# Patient Record
Sex: Male | Born: 1965 | Race: Black or African American | Hispanic: No | Marital: Married | Smoking: Never smoker
Health system: Southern US, Community
[De-identification: ages and names within clinical notes are randomized; demographics above are authoritative.]

## PROBLEM LIST (undated history)

## (undated) DIAGNOSIS — T7840XA Allergy, unspecified, initial encounter: Secondary | ICD-10-CM

## (undated) DIAGNOSIS — Z973 Presence of spectacles and contact lenses: Secondary | ICD-10-CM

## (undated) DIAGNOSIS — E785 Hyperlipidemia, unspecified: Secondary | ICD-10-CM

## (undated) HISTORY — PX: NO PAST SURGERIES: SHX2092

## (undated) HISTORY — DX: Hyperlipidemia, unspecified: E78.5

## (undated) HISTORY — DX: Allergy, unspecified, initial encounter: T78.40XA

---

## 2009-02-19 ENCOUNTER — Encounter: Admission: RE | Admit: 2009-02-19 | Discharge: 2009-02-19 | Payer: Self-pay | Admitting: Specialist

## 2011-04-05 ENCOUNTER — Ambulatory Visit (INDEPENDENT_AMBULATORY_CARE_PROVIDER_SITE_OTHER): Payer: Managed Care, Other (non HMO)

## 2011-04-05 DIAGNOSIS — Z13220 Encounter for screening for lipoid disorders: Secondary | ICD-10-CM

## 2011-04-05 DIAGNOSIS — Z125 Encounter for screening for malignant neoplasm of prostate: Secondary | ICD-10-CM

## 2011-04-05 DIAGNOSIS — Z23 Encounter for immunization: Secondary | ICD-10-CM

## 2011-04-05 DIAGNOSIS — Z0000 Encounter for general adult medical examination without abnormal findings: Secondary | ICD-10-CM

## 2012-03-26 ENCOUNTER — Ambulatory Visit (INDEPENDENT_AMBULATORY_CARE_PROVIDER_SITE_OTHER): Payer: Managed Care, Other (non HMO) | Admitting: Physician Assistant

## 2012-03-26 VITALS — BP 124/79 | HR 67 | Temp 97.9°F | Resp 16 | Ht 72.0 in | Wt 207.0 lb

## 2012-03-26 DIAGNOSIS — Z1329 Encounter for screening for other suspected endocrine disorder: Secondary | ICD-10-CM

## 2012-03-26 DIAGNOSIS — Z23 Encounter for immunization: Secondary | ICD-10-CM

## 2012-03-26 MED ORDER — TYPHOID VACCINE PO CPDR: 1.0000 | DELAYED_RELEASE_CAPSULE | ORAL | Status: DC

## 2012-03-26 MED ORDER — DOXYCYCLINE HYCLATE 100 MG PO CAPS: 100.0000 mg | ORAL_CAPSULE | Freq: Every day | ORAL | Status: DC

## 2012-03-26 NOTE — Progress Notes (Signed)
   Patient ID: Adam Cortez MRN: [id number], DOB: 1966-03-18, 46 y.o. Date of Encounter: 03/26/2012, 10:17 AM  Primary Physician: No primary provider on file.  Chief Complaint: Travel medicine  HPI: 46 y.o. year old male with history below presents for travel medicine. Will be traveling to [place] to visit with family on 04/10/12. Will be staying for one or two months, he is not certain which. He has a direct flight from the [place] to [place]. He will be be entering any other countries. He has completed his hepatitis A and hepatitis B vaccines. His last tetanus vaccine was two years ago when he last went to [place]. He also requests a flu vaccine today. He is generally healthy.    No past medical history on file.   Home Meds: Prior to Admission medications   Not on File    Allergies: No Known Allergies  History   Social History  . Marital Status: Married    Spouse Name: N/A    Number of Children: N/A  . Years of Education: N/A   Occupational History  . Not on file.   Social History Main Topics  . Smoking status: Never Smoker   . Smokeless tobacco: Not on file  . Alcohol Use: Not on file  . Drug Use: Not on file  . Sexually Active: Not on file   Other Topics Concern  . Not on file   Social History Narrative  . No narrative on file     Review of Systems: Constitutional: negative for chills, fever, night sweats, weight changes, or fatigue  Cardiovascular: negative for chest pain or palpitations Respiratory: negative for hemoptysis, wheezing, shortness of breath, or cough Abdominal: negative for abdominal pain, nausea, vomiting, diarrhea, or constipation Dermatological: negative for rash Neurologic: negative for headache   Physical Exam: Blood pressure 124/79, pulse 67, temperature 97.9 F (36.6 C), resp. rate 16, height 6' (1.829 m), weight 207 lb (93.895 kg)., Body mass index is 28.07 kg/(m^2). General: Well developed, well nourished, in no acute  distress. Head: Normocephalic, atraumatic, eyes without discharge, sclera non-icteric, nares are without discharge. Bilateral auditory canals clear, TM's are without perforation, pearly grey and translucent with reflective cone of light bilaterally. Oral cavity moist, posterior pharynx without exudate, erythema, peritonsillar abscess, or post nasal drip.  Neck: Supple. No thyromegaly. Full ROM. No lymphadenopathy. Lungs: Clear bilaterally to auscultation without wheezes, rales, or rhonchi. Breathing is unlabored. Heart: RRR with S1 S2. No murmurs, rubs, or gallops appreciated. Msk:  Strength and tone normal for age. Extremities/Skin: Warm and dry. No clubbing or cyanosis. No edema. No rashes or suspicious lesions. Neuro: Alert and oriented X 3. Moves all extremities spontaneously. Gait is normal. CNII-XII grossly in tact. Psych:  Responds to questions appropriately with a normal affect.     ASSESSMENT AND PLAN:  46 y.o. year old male here for travel medicine review. -Traveling to [place] for 1-2 months to visit family -Completed hepatitis A vaccine series -Completed hepatitis B vaccines series -Last tetanus vaccine 2 years ago -Menactra given in office today -Influenza vaccine given in office today -Vivotif 1 po qod for 4 doses, complete series greater than one week prior to exposure #4 no RF -Doxycycline 100 mg 1 po daily, start series two days before trip, continue for 4 weeks after trip #103 no RF -Declines nausea PPX   Signed, Eula Listen, PA-C 03/26/2012 10:17 AM

## 2012-03-31 ENCOUNTER — Other Ambulatory Visit: Payer: Self-pay | Admitting: Physician Assistant

## 2013-04-05 ENCOUNTER — Ambulatory Visit (INDEPENDENT_AMBULATORY_CARE_PROVIDER_SITE_OTHER): Payer: Managed Care, Other (non HMO) | Admitting: Family Medicine

## 2013-04-05 VITALS — BP 138/78 | HR 55 | Temp 97.7°F | Resp 16 | Ht 73.0 in | Wt 203.0 lb

## 2013-04-05 DIAGNOSIS — D179 Benign lipomatous neoplasm, unspecified: Secondary | ICD-10-CM

## 2013-04-05 DIAGNOSIS — Z0000 Encounter for general adult medical examination without abnormal findings: Secondary | ICD-10-CM

## 2013-04-05 LAB — TSH: TSH: 1.245 u[IU]/mL (ref 0.350–4.500)

## 2013-04-05 LAB — CBC WITH DIFFERENTIAL/PLATELET
Eosinophils Absolute: 0.1 10*3/uL (ref 0.0–0.7)
Eosinophils Relative: 1 % (ref 0–5)
MCH: 29.6 pg (ref 26.0–34.0)
MCV: 83.5 fL (ref 78.0–100.0)
Neutro Abs: 1.8 10*3/uL (ref 1.7–7.7)
Platelets: 182 10*3/uL (ref 150–400)
WBC: 4.4 10*3/uL (ref 4.0–10.5)

## 2013-04-05 LAB — COMPREHENSIVE METABOLIC PANEL
AST: 19 U/L (ref 0–37)
Albumin: 4.2 g/dL (ref 3.5–5.2)
Alkaline Phosphatase: 39 U/L (ref 39–117)
BUN: 13 mg/dL (ref 6–23)
Glucose, Bld: 102 mg/dL — ABNORMAL HIGH (ref 70–99)
Total Protein: 7.4 g/dL (ref 6.0–8.3)

## 2013-04-05 LAB — LIPID PANEL
HDL: 45 mg/dL (ref >39)
Triglycerides: 77 mg/dL (ref <150)
VLDL: 15 mg/dL (ref 0–40)

## 2013-04-05 LAB — HIV ANTIBODY (ROUTINE TESTING W REFLEX): HIV: NONREACTIVE

## 2013-04-05 LAB — RPR

## 2013-04-05 NOTE — Patient Instructions (Signed)

## 2013-04-05 NOTE — Progress Notes (Addendum)
Subjective:    Patient ID: Adam Cortez, male    DOB: 23-Mar-1966, 47 y.o.   MRN: [id number] This chart was scribed for Sherren Mocha, MD by Valera Castle, ED Scribe. This patient was seen in room 1 and the patient's care was started at 8:05 AM.  Chief Complaint  Patient presents with  . Annual Exam    HPI Adam Cortez is a 47 y.o. male who presents to the [facility] requesting an annual exam. Pt states he is from [place].  He reports a painful lump to the back right side of his shoulder, onset years ago. He states he has not tried anything for it. He reports he has mentioned the lump when seen here before, but they couldn't do anything for him. He denies having seen a surgeon yet for the lump. He reports the pain is usually at its worst when he sleeps. He reports he sleeps on both his sides and his back. He states he sleeps with 2 pillows.   He reports he has not eaten yet today. He denies any urinary symptoms, and any other associated symptoms. He denies h/o smoking. He denies any family medical history. He reports being employed. He denies family h/o prostate problems. He reports wanting his flu vaccination today.   PCP - HOPPER,DAVID, MD  There are no active problems to display for this patient.  No past medical history on file. No past surgical history on file. No Known Allergies Prior to Admission medications   Not on File  No family history on file.  Pt marked off form that he had sex with men, but pt may have been struggling with language barrier. Told us he did not understand many of the questions of the form.  History   Social History  . Marital Status: Married    Spouse Name: N/A    Number of Children: N/A  . Years of Education: N/A   Occupational History  . Not on file.   Social History Main Topics  . Smoking status: Never Smoker   . Smokeless tobacco: Not on file  . Alcohol Use: Not on file  . Drug Use: Not on file  . Sexual Activity: Not on file   Other Topics  Concern  . Not on file   Social History Narrative  . No narrative on file     Review of Systems  Genitourinary: Negative.  Negative for dysuria, urgency, frequency, flank pain, penile swelling, scrotal swelling, difficulty urinating, penile pain and testicular pain.  Musculoskeletal: Negative for arthralgias, back pain, myalgias and neck pain.  Skin:       Positive for lump over back of right shoulder.   All other systems reviewed and are negative.      BP 138/78  Pulse 55  Temp(Src) 97.7 F (36.5 C)  Resp 16  Ht 6\' 1"  (1.854 m)  Wt 203 lb (92.08 kg)  BMI 26.79 kg/m2  SpO2 100% Objective:   Physical Exam  Nursing note and vitals reviewed. Constitutional: He is oriented to person, place, and time. He appears well-developed and well-nourished. No distress.  HENT:  Head: Normocephalic and atraumatic.  Right Ear: Tympanic membrane, external ear and ear canal normal.  Left Ear: Tympanic membrane, external ear and ear canal normal.  Nose: Nose normal.  Mouth/Throat: Uvula is midline, oropharynx is clear and moist and mucous membranes are normal. No oropharyngeal exudate, posterior oropharyngeal edema or posterior oropharyngeal erythema.  Eyes: Conjunctivae and EOM are normal.  Neck: Trachea  normal. Neck supple. No rigidity. No tracheal deviation, no edema, no erythema and normal range of motion present. No mass and no thyromegaly present.  Cardiovascular: Normal rate, regular rhythm and normal heart sounds.  Exam reveals no gallop and no friction rub.   No murmur heard. Pulmonary/Chest: Effort normal and breath sounds normal. No respiratory distress. He has no wheezes. He has no rales.  Abdominal: Soft. He exhibits no distension. There is no tenderness. There is no rebound and no guarding.  Musculoskeletal: Normal range of motion. He exhibits no tenderness.  Neurological: He is alert and oriented to person, place, and time. He has normal reflexes. He displays normal reflexes.    Reflex Scores:      Bicep reflexes are 2+ on the right side and 2+ on the left side.      Brachioradialis reflexes are 2+ on the right side and 2+ on the left side.      Patellar reflexes are 2+ on the right side and 2+ on the left side. Skin: Skin is warm and dry.  11 cm round, soft, non mobile, subdermal mass, sub defined approximately 3 cm in height over right upper thoracic between spine and scapula.  Psychiatric: He has a normal mood and affect. His behavior is normal.       Assessment & Plan:  Routine general medical examination at a health care facility - Plan: GC/Chlamydia Probe Amp, Lipid panel, Comprehensive metabolic panel, CBC with Differential, HIV antibody, Hepatitis C antibody, RPR, TSH  Lipoma - Plan: Ambulatory referral to General Surgery  No orders of the defined types were placed in this encounter.   -Completed hepatitis A vaccine series  -Completed hepatitis B vaccines series  -Last tetanus vaccine 2011  -Menactra given in office 03/26/2012 -Oral typhoid vaccine 03/2012 - Flu vaccine today  I personally performed the services described in this documentation, which was scribed in my presence. The recorded information has been reviewed and considered, and addended by me as needed.  Norberto Sorenson, MD MPH

## 2013-04-06 ENCOUNTER — Encounter: Payer: Self-pay | Admitting: Family Medicine

## 2013-04-06 LAB — GC/CHLAMYDIA PROBE AMP: GC Probe RNA: NEGATIVE

## 2013-04-13 ENCOUNTER — Ambulatory Visit (INDEPENDENT_AMBULATORY_CARE_PROVIDER_SITE_OTHER): Payer: Managed Care, Other (non HMO) | Admitting: Surgery

## 2013-05-09 ENCOUNTER — Encounter (INDEPENDENT_AMBULATORY_CARE_PROVIDER_SITE_OTHER): Payer: Self-pay | Admitting: Surgery

## 2013-05-09 ENCOUNTER — Ambulatory Visit (INDEPENDENT_AMBULATORY_CARE_PROVIDER_SITE_OTHER): Payer: Managed Care, Other (non HMO) | Admitting: Surgery

## 2013-05-09 ENCOUNTER — Encounter (INDEPENDENT_AMBULATORY_CARE_PROVIDER_SITE_OTHER): Payer: Self-pay

## 2013-05-09 VITALS — BP 126/82 | HR 64 | Temp 97.3°F | Resp 14 | Ht 75.0 in | Wt 204.8 lb

## 2013-05-09 DIAGNOSIS — D1779 Benign lipomatous neoplasm of other sites: Secondary | ICD-10-CM

## 2013-05-09 DIAGNOSIS — D171 Benign lipomatous neoplasm of skin and subcutaneous tissue of trunk: Secondary | ICD-10-CM

## 2013-05-09 NOTE — Progress Notes (Signed)
Patient ID: Adam Cortez, male   DOB: 1965/08/22, 48 y.o.   MRN: [id number]  Chief Complaint  Patient presents with  . New Evaluation    eval lump on backside of rt shoulder    HPI Adam Cortez is a 48 y.o. male.  Referred by Dr. Delman Cheadle for evaluation of mass on back HPI This is a healthy 48 year old male who presents with several years of an enlarging mass on the right side of his back. This has become quite large and uncomfortable. The patient is fairly active. This mass does not interfere with his activity. He presents now to discuss excision. He denies any episodes of infection in this area.   History reviewed. No pertinent past medical history.  History reviewed. No pertinent past surgical history.  History reviewed. No pertinent family history.  Social History History  Substance Use Topics  . Smoking status: Never Smoker   . Smokeless tobacco: Never Used  . Alcohol Use: No    No Known Allergies  No current outpatient prescriptions on file.   No current facility-administered medications for this visit.    Review of Systems Review of Systems  Constitutional: Negative for fever, chills and unexpected weight change.  HENT: Negative for congestion, hearing loss, sore throat, trouble swallowing and voice change.   Eyes: Negative for visual disturbance.  Respiratory: Negative for cough and wheezing.   Cardiovascular: Negative for chest pain, palpitations and leg swelling.  Gastrointestinal: Negative for nausea, vomiting, abdominal pain, diarrhea, constipation, blood in stool, abdominal distention, anal bleeding and rectal pain.  Genitourinary: Negative for hematuria and difficulty urinating.  Musculoskeletal: Negative for arthralgias.  Skin: Negative for rash and wound.  Neurological: Negative for seizures, syncope, weakness and headaches.  Hematological: Negative for adenopathy. Does not bruise/bleed easily.  Psychiatric/Behavioral: Negative for confusion.    Blood  pressure 126/82, pulse 64, temperature 97.3 F (36.3 C), temperature source Temporal, resp. rate 14, height 6\' 3"  (1.905 m), weight 204 lb 12.8 oz (92.897 kg).  Physical Exam Physical Exam WDWN in NAD HEENT:  EOMI, sclera anicteric Neck:  No masses, no thyromegaly Lungs:  CTA bilaterally; normal respiratory effort CV:  Regular rate and rhythm; no murmurs Abd:  +bowel sounds, soft, non-tender, no masses Ext:  Well-perfused; no edema Skin:  Warm, dry; back - medial to right scapula - 11 cm protruding subcutaneous mass; well-demarcated; non-tender  Data Reviewed none  Assessment    Subcutaneous lipoma - back 11 cm     Plan    Excision of subcutaneous lipoma - back 11 cm.  The surgical procedure has been discussed with the patient.  Potential risks, benefits, alternative treatments, and expected outcomes have been explained.  All of the patient's questions at this time have been answered.  The likelihood of reaching the patient's treatment goal is good.  The patient understand the proposed surgical procedure and wishes to proceed.         Adam Capili K. 05/09/2013, 10:22 AM

## 2013-05-30 ENCOUNTER — Encounter (HOSPITAL_BASED_OUTPATIENT_CLINIC_OR_DEPARTMENT_OTHER): Payer: Self-pay | Admitting: *Deleted

## 2013-05-30 NOTE — Progress Notes (Signed)
No labs needed

## 2013-06-03 ENCOUNTER — Ambulatory Visit (HOSPITAL_BASED_OUTPATIENT_CLINIC_OR_DEPARTMENT_OTHER)
Admission: RE | Admit: 2013-06-03 | Discharge: 2013-06-03 | Disposition: A | Discharge status: Home / Self Care | Payer: Managed Care, Other (non HMO) | Source: Ambulatory Visit | Attending: Surgery | Admitting: Surgery

## 2013-06-03 ENCOUNTER — Encounter (HOSPITAL_BASED_OUTPATIENT_CLINIC_OR_DEPARTMENT_OTHER): Payer: Self-pay | Admitting: Certified Registered"

## 2013-06-03 ENCOUNTER — Encounter (HOSPITAL_BASED_OUTPATIENT_CLINIC_OR_DEPARTMENT_OTHER)
Admission: RE | Disposition: A | Discharge status: Home / Self Care | Payer: Self-pay | Source: Ambulatory Visit | Attending: Surgery

## 2013-06-03 ENCOUNTER — Ambulatory Visit (HOSPITAL_BASED_OUTPATIENT_CLINIC_OR_DEPARTMENT_OTHER): Payer: Managed Care, Other (non HMO) | Admitting: Certified Registered"

## 2013-06-03 ENCOUNTER — Encounter (HOSPITAL_BASED_OUTPATIENT_CLINIC_OR_DEPARTMENT_OTHER): Payer: Managed Care, Other (non HMO) | Admitting: Certified Registered"

## 2013-06-03 DIAGNOSIS — D1739 Benign lipomatous neoplasm of skin and subcutaneous tissue of other sites: Secondary | ICD-10-CM

## 2013-06-03 DIAGNOSIS — D171 Benign lipomatous neoplasm of skin and subcutaneous tissue of trunk: Secondary | ICD-10-CM

## 2013-06-03 DIAGNOSIS — D1779 Benign lipomatous neoplasm of other sites: Secondary | ICD-10-CM | POA: Insufficient documentation

## 2013-06-03 HISTORY — PX: LIPOMA EXCISION: SHX5283

## 2013-06-03 HISTORY — DX: Presence of spectacles and contact lenses: Z97.3

## 2013-06-03 LAB — POCT HEMOGLOBIN-HEMACUE: Hemoglobin: 16.1 g/dL (ref 13.0–17.0)

## 2013-06-03 SURGERY — EXCISION LIPOMA
Anesthesia: General | Site: Back | Laterality: Right

## 2013-06-03 MED ORDER — OXYCODONE HCL 5 MG/5ML PO SOLN: 5.0000 mg | Freq: Once | ORAL | Status: DC | PRN

## 2013-06-03 MED ORDER — BUPIVACAINE-EPINEPHRINE PF 0.25-1:200000 % IJ SOLN
INTRAMUSCULAR | Status: AC
  Filled 2013-06-03: qty 30

## 2013-06-03 MED ORDER — CEFAZOLIN SODIUM-DEXTROSE 2-3 GM-% IV SOLR
INTRAVENOUS | Status: AC
  Filled 2013-06-03: qty 50

## 2013-06-03 MED ORDER — DEXAMETHASONE SODIUM PHOSPHATE 4 MG/ML IJ SOLN
INTRAMUSCULAR | Status: DC | PRN
  Administered 2013-06-03: 10 mg via INTRAVENOUS

## 2013-06-03 MED ORDER — PROMETHAZINE HCL 25 MG/ML IJ SOLN: 6.2500 mg | INTRAMUSCULAR | Status: DC | PRN

## 2013-06-03 MED ORDER — HYDROMORPHONE HCL PF 1 MG/ML IJ SOLN: 0.2500 mg | INTRAMUSCULAR | Status: DC | PRN

## 2013-06-03 MED ORDER — FENTANYL CITRATE 0.05 MG/ML IJ SOLN: 50.0000 ug | INTRAMUSCULAR | Status: DC | PRN

## 2013-06-03 MED ORDER — LIDOCAINE HCL 4 % MT SOLN
OROMUCOSAL | Status: DC | PRN
  Administered 2013-06-03: 4 mL via TOPICAL

## 2013-06-03 MED ORDER — HYDROCODONE-ACETAMINOPHEN 5-325 MG PO TABS: 1.0000 | ORAL_TABLET | ORAL | Status: DC | PRN

## 2013-06-03 MED ORDER — OXYCODONE HCL 5 MG PO TABS: 5.0000 mg | ORAL_TABLET | Freq: Once | ORAL | Status: DC | PRN

## 2013-06-03 MED ORDER — PROPOFOL 10 MG/ML IV BOLUS
INTRAVENOUS | Status: DC | PRN
  Administered 2013-06-03: 200 mg via INTRAVENOUS

## 2013-06-03 MED ORDER — ONDANSETRON HCL 4 MG/2ML IJ SOLN
INTRAMUSCULAR | Status: DC | PRN
  Administered 2013-06-03: 4 mg via INTRAVENOUS

## 2013-06-03 MED ORDER — KETOROLAC TROMETHAMINE 30 MG/ML IJ SOLN
INTRAMUSCULAR | Status: DC | PRN
  Administered 2013-06-03: 30 mg via INTRAVENOUS

## 2013-06-03 MED ORDER — CEFAZOLIN SODIUM-DEXTROSE 2-3 GM-% IV SOLR
2.0000 g | INTRAVENOUS | Status: AC
  Administered 2013-06-03: 2 g via INTRAVENOUS

## 2013-06-03 MED ORDER — SUCCINYLCHOLINE CHLORIDE 20 MG/ML IJ SOLN
INTRAMUSCULAR | Status: AC
  Filled 2013-06-03: qty 1

## 2013-06-03 MED ORDER — FENTANYL CITRATE 0.05 MG/ML IJ SOLN
INTRAMUSCULAR | Status: AC
  Filled 2013-06-03: qty 6

## 2013-06-03 MED ORDER — FENTANYL CITRATE 0.05 MG/ML IJ SOLN
INTRAMUSCULAR | Status: DC | PRN
  Administered 2013-06-03: 100 ug via INTRAVENOUS

## 2013-06-03 MED ORDER — CHLORHEXIDINE GLUCONATE 4 % EX LIQD: 1.0000 "application " | Freq: Once | CUTANEOUS | Status: DC

## 2013-06-03 MED ORDER — ONDANSETRON HCL 4 MG/2ML IJ SOLN: 4.0000 mg | INTRAMUSCULAR | Status: DC | PRN

## 2013-06-03 MED ORDER — BUPIVACAINE-EPINEPHRINE 0.25% -1:200000 IJ SOLN
INTRAMUSCULAR | Status: DC | PRN
  Administered 2013-06-03: 20 mL

## 2013-06-03 MED ORDER — MIDAZOLAM HCL 2 MG/2ML IJ SOLN
INTRAMUSCULAR | Status: AC
  Filled 2013-06-03: qty 2

## 2013-06-03 MED ORDER — MORPHINE SULFATE 2 MG/ML IJ SOLN: 2.0000 mg | INTRAMUSCULAR | Status: DC | PRN

## 2013-06-03 MED ORDER — LACTATED RINGERS IV SOLN
INTRAVENOUS | Status: DC
Start: 2013-06-03 — End: 2013-06-03
  Administered 2013-06-03: 12:00:00 via INTRAVENOUS

## 2013-06-03 MED ORDER — MIDAZOLAM HCL 2 MG/2ML IJ SOLN: 1.0000 mg | INTRAMUSCULAR | Status: DC | PRN

## 2013-06-03 MED ORDER — MIDAZOLAM HCL 5 MG/5ML IJ SOLN
INTRAMUSCULAR | Status: DC | PRN
  Administered 2013-06-03: 2 mg via INTRAVENOUS

## 2013-06-03 MED ORDER — SUCCINYLCHOLINE CHLORIDE 20 MG/ML IJ SOLN
INTRAMUSCULAR | Status: DC | PRN
  Administered 2013-06-03: 100 mg via INTRAVENOUS

## 2013-06-03 SURGICAL SUPPLY — 45 items
BENZOIN TINCTURE PRP APPL 2/3 (GAUZE/BANDAGES/DRESSINGS) ×3 IMPLANT
BLADE SURG 15 STRL LF DISP TIS (BLADE) ×1 IMPLANT
BLADE SURG 15 STRL SS (BLADE) ×2
BLADE SURG ROTATE 9660 (MISCELLANEOUS) IMPLANT
CANISTER SUCT 1200ML W/VALVE (MISCELLANEOUS) IMPLANT
CHLORAPREP W/TINT 26ML (MISCELLANEOUS) ×3 IMPLANT
CLOSURE WOUND 1/2 X4 (GAUZE/BANDAGES/DRESSINGS) ×1
COVER MAYO STAND STRL (DRAPES) ×3 IMPLANT
COVER TABLE BACK 60X90 (DRAPES) ×3 IMPLANT
DECANTER SPIKE VIAL GLASS SM (MISCELLANEOUS) IMPLANT
DRAIN JP 10F RND SILICONE (MISCELLANEOUS) ×3 IMPLANT
DRAPE PED LAPAROTOMY (DRAPES) ×3 IMPLANT
DRAPE UTILITY XL STRL (DRAPES) ×3 IMPLANT
ELECT COATED BLADE 2.86 ST (ELECTRODE) ×3 IMPLANT
ELECT REM PT RETURN 9FT ADLT (ELECTROSURGICAL) ×3
ELECTRODE REM PT RTRN 9FT ADLT (ELECTROSURGICAL) ×1 IMPLANT
EVACUATOR SILICONE 100CC (DRAIN) ×3 IMPLANT
GLOVE BIO SURGEON STRL SZ7 (GLOVE) ×3 IMPLANT
GLOVE BIOGEL PI IND STRL 7.0 (GLOVE) ×1 IMPLANT
GLOVE BIOGEL PI IND STRL 7.5 (GLOVE) ×1 IMPLANT
GLOVE BIOGEL PI INDICATOR 7.0 (GLOVE) ×2
GLOVE BIOGEL PI INDICATOR 7.5 (GLOVE) ×2
GLOVE ECLIPSE 6.5 STRL STRAW (GLOVE) ×3 IMPLANT
GOWN STRL REUS W/ TWL LRG LVL3 (GOWN DISPOSABLE) ×2 IMPLANT
GOWN STRL REUS W/TWL LRG LVL3 (GOWN DISPOSABLE) ×4
NEEDLE HYPO 25X1 1.5 SAFETY (NEEDLE) ×3 IMPLANT
NS IRRIG 1000ML POUR BTL (IV SOLUTION) ×3 IMPLANT
PACK BASIN DAY SURGERY FS (CUSTOM PROCEDURE TRAY) ×3 IMPLANT
PENCIL BUTTON HOLSTER BLD 10FT (ELECTRODE) ×3 IMPLANT
PIN SAFETY STERILE (MISCELLANEOUS) ×3 IMPLANT
SPONGE GAUZE 4X4 12PLY STER LF (GAUZE/BANDAGES/DRESSINGS) ×3 IMPLANT
STRIP CLOSURE SKIN 1/2X4 (GAUZE/BANDAGES/DRESSINGS) ×2 IMPLANT
SUT ETHILON 2 0 FS 18 (SUTURE) ×3 IMPLANT
SUT MON AB 4-0 PC3 18 (SUTURE) ×3 IMPLANT
SUT PROLENE 6 0 P 1 18 (SUTURE) IMPLANT
SUT SILK 2 0 FS (SUTURE) IMPLANT
SUT VIC AB 3-0 SH 27 (SUTURE)
SUT VIC AB 3-0 SH 27X BRD (SUTURE) IMPLANT
SUT VICRYL 3-0 CR8 SH (SUTURE) ×3 IMPLANT
SYR CONTROL 10ML LL (SYRINGE) ×3 IMPLANT
TOWEL OR 17X24 6PK STRL BLUE (TOWEL DISPOSABLE) ×3 IMPLANT
TOWEL OR NON WOVEN STRL DISP B (DISPOSABLE) ×3 IMPLANT
TUBE CONNECTING 20'X1/4 (TUBING)
TUBE CONNECTING 20X1/4 (TUBING) IMPLANT
YANKAUER SUCT BULB TIP NO VENT (SUCTIONS) IMPLANT

## 2013-06-03 NOTE — Interval H&P Note (Signed)
History and Physical Interval Note:  06/03/2013 11:43 AM  Adam Cortez  has presented today for surgery, with the diagnosis of Subcutaneous lipoma - back - 11 cm  The various methods of treatment have been discussed with the patient and family. After consideration of risks, benefits and other options for treatment, the patient has consented to  Procedure(s): EXCISION LIPOMA BACK (N/A) as a surgical intervention .  The patient's history has been reviewed, patient examined, no change in status, stable for surgery.  I have reviewed the patient's chart and labs.  Questions were answered to the patient's satisfaction.     Tanaiya Kolarik K.

## 2013-06-03 NOTE — Op Note (Signed)
Preop diagnosis: 11 cm subcutaneous lipoma of the back Postop diagnosis: Same Procedure performed: Excision of subcutaneous lipoma of the back Surgeon:Marilyn Nihiser K. Anesthesia: Gen. Indications: This is a 48 year old male in good health who presents with Several years of an enlarging mass in his back. This has become very large and uncomfortable. He presents now for excision.  Description of procedure: The patient brought to the operating room and placed in supine position on the stretcher. After an adequate level of general anesthesia was obtained, the patient was flipped to a prone position on the table. The upper part of his back was prepped with chlor prep and draped in sterile fashion. A timeout was taken to ensure the proper patient and proper procedure. We infiltrated the area around the mass with 0.25% Marcaine with epinephrine. A transverse incision was made and dissection was carried down to the surface of the lipoma. The lipoma had multiple lobules and "fingers" extending into the subcutaneous tissues. We had to bluntly dissect each of these lobules free from the surrounding tissue. The lipoma was adherent to the fascia of the underlying muscle. We excised the lipoma in its entirety. We examined carefully for hemostasis. We infiltrated the surrounding muscle with 0.25% Marcaine with epinephrine. A 10 French round drain was placed in the subcutaneous tissues through a stab incision. The wound was closed with a deep layer of interrupted 3-0 Vicryl suture and a subcuticular layer of 4-0 Monocryl. Steri-Strips were applied. The drain was secured with a nylon suture.  The patient was then extubated and brought to recovery in stable condition. All sponge, initially, and needle counts are correct.  Imogene Burn. Georgette Dover, MD, [facility] Surgery  General/ Trauma Surgery  06/03/2013 1:49 PM

## 2013-06-03 NOTE — Anesthesia Preprocedure Evaluation (Addendum)
Anesthesia Evaluation  Patient identified by MRN, date of birth, ID band Patient awake    Reviewed: Allergy & Precautions, H&P , NPO status , Patient's Chart, lab work & pertinent test results  Airway Mallampati: I TM Distance: >3 FB     Dental  (+) Dental Advidsory Given and Teeth Intact   Pulmonary neg pulmonary ROS,  breath sounds clear to auscultation        Cardiovascular negative cardio ROS  Rhythm:regular Rate:Normal     Neuro/Psych negative neurological ROS  negative psych ROS   GI/Hepatic negative GI ROS, Neg liver ROS,   Endo/Other  negative endocrine ROS  Renal/GU negative Renal ROS     Musculoskeletal   Abdominal   Peds  Hematology negative hematology ROS (+)   Anesthesia Other Findings   Reproductive/Obstetrics negative OB ROS                           Anesthesia Physical Anesthesia Plan  ASA: I  Anesthesia Plan: General ETT   Post-op Pain Management:    Induction:   Airway Management Planned:   Additional Equipment:   Intra-op Plan:   Post-operative Plan:   Informed Consent: I have reviewed the patients History and Physical, chart, labs and discussed the procedure including the risks, benefits and alternatives for the proposed anesthesia with the patient or authorized representative who has indicated his/her understanding and acceptance.   Dental Advisory Given  Plan Discussed with: Anesthesiologist, CRNA and Surgeon  Anesthesia Plan Comments:         Anesthesia Quick Evaluation

## 2013-06-03 NOTE — H&P (View-Only) (Signed)
Patient ID: Adam Cortez, male   DOB: 09/10/1965, 47 y.o.   MRN: [id number]  Chief Complaint  Patient presents with  . New Evaluation    eval lump on backside of rt shoulder    HPI Adam Cortez is a 47 y.o. male.  Referred by Dr. Eva Shaw for evaluation of mass on back HPI This is a healthy 47-year-old male who presents with several years of an enlarging mass on the right side of his back. This has become quite large and uncomfortable. The patient is fairly active. This mass does not interfere with his activity. He presents now to discuss excision. He denies any episodes of infection in this area.   History reviewed. No pertinent past medical history.  History reviewed. No pertinent past surgical history.  History reviewed. No pertinent family history.  Social History History  Substance Use Topics  . Smoking status: Never Smoker   . Smokeless tobacco: Never Used  . Alcohol Use: No    No Known Allergies  No current outpatient prescriptions on file.   No current facility-administered medications for this visit.    Review of Systems Review of Systems  Constitutional: Negative for fever, chills and unexpected weight change.  HENT: Negative for congestion, hearing loss, sore throat, trouble swallowing and voice change.   Eyes: Negative for visual disturbance.  Respiratory: Negative for cough and wheezing.   Cardiovascular: Negative for chest pain, palpitations and leg swelling.  Gastrointestinal: Negative for nausea, vomiting, abdominal pain, diarrhea, constipation, blood in stool, abdominal distention, anal bleeding and rectal pain.  Genitourinary: Negative for hematuria and difficulty urinating.  Musculoskeletal: Negative for arthralgias.  Skin: Negative for rash and wound.  Neurological: Negative for seizures, syncope, weakness and headaches.  Hematological: Negative for adenopathy. Does not bruise/bleed easily.  Psychiatric/Behavioral: Negative for confusion.    Blood  pressure 126/82, pulse 64, temperature 97.3 F (36.3 C), temperature source Temporal, resp. rate 14, height 6' 3" (1.905 m), weight 204 lb 12.8 oz (92.897 kg).  Physical Exam Physical Exam WDWN in NAD HEENT:  EOMI, sclera anicteric Neck:  No masses, no thyromegaly Lungs:  CTA bilaterally; normal respiratory effort CV:  Regular rate and rhythm; no murmurs Abd:  +bowel sounds, soft, non-tender, no masses Ext:  Well-perfused; no edema Skin:  Warm, dry; back - medial to right scapula - 11 cm protruding subcutaneous mass; well-demarcated; non-tender  Data Reviewed none  Assessment    Subcutaneous lipoma - back 11 cm     Plan    Excision of subcutaneous lipoma - back 11 cm.  The surgical procedure has been discussed with the patient.  Potential risks, benefits, alternative treatments, and expected outcomes have been explained.  All of the patient's questions at this time have been answered.  The likelihood of reaching the patient's treatment goal is good.  The patient understand the proposed surgical procedure and wishes to proceed.         [name] 05/09/2013, 10:22 AM    

## 2013-06-03 NOTE — Discharge Instructions (Signed)
Empty the drain and record the amount of output daily. Keep the wound dry until the drain is removed next Tuesday. He may apply an ice pack to the area to help with pain. He may use the pain medicine as needed for pain. Tried to avoid constipation by using a stool softener. My office will contact you for the appointment time next Tuesday.     Post Anesthesia Home Care Instructions  Activity: Get plenty of rest for the remainder of the day. A responsible adult should stay with you for 24 hours following the procedure.  For the next 24 hours, DO NOT: -Drive a car -Paediatric nurse -Drink alcoholic beverages -Take any medication unless instructed by your physician -Make any legal decisions or sign important papers.  Meals: Start with liquid foods such as gelatin or soup. Progress to regular foods as tolerated. Avoid greasy, spicy, heavy foods. If nausea and/or vomiting occur, drink only clear liquids until the nausea and/or vomiting subsides. Call your physician if vomiting continues.  Special Instructions/Symptoms: Your throat may feel dry or sore from the anesthesia or the breathing tube placed in your throat during surgery. If this causes discomfort, gargle with warm salt water. The discomfort should disappear within 24 hours.

## 2013-06-03 NOTE — Anesthesia Postprocedure Evaluation (Signed)
Anesthesia Post Note  Patient: Adam Cortez  Procedure(s) Performed: Procedure(s) (LRB): EXCISION LIPOMA BACK (Right)  Anesthesia type: general  Patient location: PACU  Post pain: Pain level controlled  Post assessment: Patient's Cardiovascular Status Stable  Last Vitals:  Filed Vitals:   06/03/13 1430  BP: 135/94  Pulse: 58  Temp:   Resp: 14    Post vital signs: Reviewed and stable  Level of consciousness: sedated  Complications: No apparent anesthesia complications

## 2013-06-03 NOTE — Anesthesia Procedure Notes (Signed)
Procedure Name: Intubation Date/Time: 06/03/2013 12:45 PM Performed by: Ayat Drenning Pre-anesthesia Checklist: Patient identified, Emergency Drugs available, Suction available and Patient being monitored Patient Re-evaluated:Patient Re-evaluated prior to inductionOxygen Delivery Method: Circle System Utilized Preoxygenation: Pre-oxygenation with 100% oxygen Intubation Type: IV induction Ventilation: Mask ventilation without difficulty Laryngoscope Size: Mac and 3 Grade View: Grade I Tube type: Oral Tube size: 7.0 mm Number of attempts: 1 Airway Equipment and Method: stylet and oral airway Placement Confirmation: ETT inserted through vocal cords under direct vision,  positive ETCO2 and breath sounds checked- equal and bilateral Secured at: 22 cm Tube secured with: Tape Dental Injury: Teeth and Oropharynx as per pre-operative assessment

## 2013-06-03 NOTE — Transfer of Care (Signed)
Immediate Anesthesia Transfer of Care Note  Patient: Adam Cortez  Procedure(s) Performed: Procedure(s): EXCISION LIPOMA BACK (Right)  Patient Location: PACU  Anesthesia Type:General  Level of Consciousness: awake and sedated  Airway & Oxygen Therapy: Patient Spontanous Breathing and Patient connected to face mask oxygen  Post-op Assessment: Report given to PACU RN and Post -op Vital signs reviewed and stable  Post vital signs: Reviewed and stable  Complications: No apparent anesthesia complications

## 2013-06-06 ENCOUNTER — Encounter (HOSPITAL_BASED_OUTPATIENT_CLINIC_OR_DEPARTMENT_OTHER): Payer: Self-pay | Admitting: Surgery

## 2013-06-07 ENCOUNTER — Encounter (INDEPENDENT_AMBULATORY_CARE_PROVIDER_SITE_OTHER): Payer: Self-pay | Admitting: Surgery

## 2013-06-07 ENCOUNTER — Ambulatory Visit (INDEPENDENT_AMBULATORY_CARE_PROVIDER_SITE_OTHER): Payer: Managed Care, Other (non HMO) | Admitting: Surgery

## 2013-06-07 DIAGNOSIS — D1779 Benign lipomatous neoplasm of other sites: Secondary | ICD-10-CM

## 2013-06-07 DIAGNOSIS — D171 Benign lipomatous neoplasm of skin and subcutaneous tissue of trunk: Secondary | ICD-10-CM

## 2013-06-07 NOTE — Progress Notes (Signed)
Status post excision of a large lipoma from the back on 06/03/13. The patient has a drain in place. There is minimal output. The incision is healing well no sign of infection. The drain is removed. The patient may followup as needed. He may resume full activity.  Imogene Burn. Georgette Dover, MD, [facility] Surgery  General/ Trauma Surgery  06/07/2013 4:21 PM

## 2013-06-15 ENCOUNTER — Encounter (INDEPENDENT_AMBULATORY_CARE_PROVIDER_SITE_OTHER): Payer: Managed Care, Other (non HMO) | Admitting: Surgery

## 2014-02-22 ENCOUNTER — Ambulatory Visit (INDEPENDENT_AMBULATORY_CARE_PROVIDER_SITE_OTHER): Payer: Managed Care, Other (non HMO) | Admitting: Physician Assistant

## 2014-02-22 VITALS — BP 150/90 | HR 66 | Temp 98.2°F | Resp 16 | Ht 72.0 in | Wt 200.0 lb

## 2014-02-22 DIAGNOSIS — Z23 Encounter for immunization: Secondary | ICD-10-CM

## 2014-02-22 DIAGNOSIS — Z7189 Other specified counseling: Secondary | ICD-10-CM

## 2014-02-22 DIAGNOSIS — Z7184 Encounter for health counseling related to travel: Secondary | ICD-10-CM

## 2014-02-22 MED ORDER — DOXYCYCLINE HYCLATE 100 MG PO CAPS: ORAL_CAPSULE | ORAL | Status: DC

## 2014-02-22 NOTE — Progress Notes (Signed)
I have discussed this case with Ms. Bush, PA-C and agree.  

## 2014-02-22 NOTE — Patient Instructions (Signed)
Try to find an up to date vaccine record If no yellow fever vaccine in past year, go to health dept. For booster Take doxycycline starting 2 days before travel, daily while in [place] and for 28 days after you return. Have a great trip!

## 2014-02-22 NOTE — Progress Notes (Signed)
Subjective:    Patient ID: Adam Cortez, male    DOB: 10/04/1965, 48 y.o.   MRN: [id number] Patient Active Problem List   Diagnosis Date Noted  . Lipoma of back - 11 cm 05/09/2013   Prior to Admission medications   Medication Sig Start Date End Date Taking? Authorizing Provider  doxycycline (VIBRAMYCIN) 100 MG capsule Take 1 pill daily starting 2 days before travel, daily while traveling, and daily for 28 days after you return 02/22/14   Ezekiel Slocumb, PA-C   HPI  This is a 48 year old male with no significant PMH here for travel vaccines. He is traveling to [place] on the 25th of November. He will be gone for 2 months. The last time he traveled to [place] was 03/2012. He does not have an immunization record with him. Last tetanus was in 2011. Last typhoid vaccine in 2013. He has finished his Hepatitis A and B vaccines. He is not sure, but thinks he got yellow fever vaccine in the past 10 years.  His BP is a little elevated today. He reports when he has taken it at drug stores, it is lower. He does not have a BP monitor at home.    Review of Systems  Constitutional: Negative for fever and chills.  HENT: Negative.   Eyes: Negative.   Respiratory: Negative for cough and shortness of breath.   Cardiovascular: Negative for chest pain.  Gastrointestinal: Negative.   Skin: Negative.   Allergic/Immunologic: Positive for environmental allergies.       Objective:   Physical Exam  Constitutional: He is oriented to person, place, and time. He appears well-developed and well-nourished. No distress.  HENT:  Head: Normocephalic and atraumatic.  Right Ear: Hearing, tympanic membrane, external ear and ear canal normal.  Left Ear: Hearing, tympanic membrane, external ear and ear canal normal.  Nose: Nose normal. No mucosal edema.  Mouth/Throat: Uvula is midline, oropharynx is clear and moist and mucous membranes are normal. No oropharyngeal exudate or posterior oropharyngeal edema.  Eyes:  Conjunctivae and lids are normal. Right eye exhibits no discharge. Left eye exhibits no discharge. No scleral icterus.  Cardiovascular: Normal rate, regular rhythm and normal heart sounds.   No murmur heard. Pulmonary/Chest: Effort normal and breath sounds normal. No respiratory distress. He has no wheezes. He has no rhonchi. He has no rales.  Abdominal: Soft. There is no tenderness.  Musculoskeletal: Normal range of motion.  Lymphadenopathy:    He has no cervical adenopathy.  Neurological: He is alert and oriented to person, place, and time.  Skin: Skin is warm, dry and intact. No lesion and no rash noted.  Psychiatric: He has a normal mood and affect. His speech is normal and behavior is normal. Thought content normal.      Assessment & Plan:  1. Travel advice encounter  He is up to date on all vaccines needed for his trip to [place]. He thinks he got yellow fever in the past year - he will check at home to make sure and will go to the health dept to get a booster if none within the past 10 years. He will take doxycycline for malaria prophylaxis.  - doxycycline (VIBRAMYCIN) 100 MG capsule; Take 1 pill daily starting 2 days before travel, daily while traveling, and daily for 28 days after you return  Dispense: 90 capsule; Refill: 0  2. Need for influenza vaccination - Flu Vaccine QUAD 36+ mos IM   Benjaman Pott. Drenda Freeze, [facility]  02/22/2014

## 2014-07-21 ENCOUNTER — Encounter (HOSPITAL_COMMUNITY): Payer: Self-pay | Admitting: Emergency Medicine

## 2014-07-21 ENCOUNTER — Emergency Department (HOSPITAL_COMMUNITY): Payer: Managed Care, Other (non HMO)

## 2014-07-21 ENCOUNTER — Emergency Department (HOSPITAL_COMMUNITY)
Admission: EM | Admit: 2014-07-21 | Discharge: 2014-07-21 | Disposition: A | Discharge status: Home / Self Care | Payer: Managed Care, Other (non HMO) | Attending: Emergency Medicine | Admitting: Emergency Medicine

## 2014-07-21 DIAGNOSIS — Z79899 Other long term (current) drug therapy: Secondary | ICD-10-CM | POA: Insufficient documentation

## 2014-07-21 DIAGNOSIS — R079 Chest pain, unspecified: Secondary | ICD-10-CM | POA: Diagnosis present

## 2014-07-21 LAB — TROPONIN I: Troponin I: <0.03 ng/mL (ref <0.031)

## 2014-07-21 LAB — BRAIN NATRIURETIC PEPTIDE: B NATRIURETIC PEPTIDE 5: 15 pg/mL (ref 0.0–100.0)

## 2014-07-21 LAB — CBC
HEMATOCRIT: 41.2 % (ref 39.0–52.0)
HEMOGLOBIN: 14.1 g/dL (ref 13.0–17.0)
MCH: 29.7 pg (ref 26.0–34.0)
MCHC: 34.2 g/dL (ref 30.0–36.0)
MCV: 86.9 fL (ref 78.0–100.0)
Platelets: 152 10*3/uL (ref 150–400)
RBC: 4.74 MIL/uL (ref 4.22–5.81)
RDW: 12.4 % (ref 11.5–15.5)
WBC: 10.3 10*3/uL (ref 4.0–10.5)

## 2014-07-21 LAB — BASIC METABOLIC PANEL
Anion gap: 11 (ref 5–15)
BUN: 16 mg/dL (ref 6–23)
CO2: 23 mmol/L (ref 19–32)
Calcium: 9.2 mg/dL (ref 8.4–10.5)
Chloride: 100 mmol/L (ref 96–112)
Creatinine, Ser: 1.51 mg/dL — ABNORMAL HIGH (ref 0.50–1.35)
GFR calc Af Amer: 62 mL/min — ABNORMAL LOW (ref >90)
GFR calc non Af Amer: 54 mL/min — ABNORMAL LOW (ref >90)
GLUCOSE: 124 mg/dL — AB (ref 70–99)
POTASSIUM: 3.6 mmol/L (ref 3.5–5.1)
SODIUM: 134 mmol/L — AB (ref 135–145)

## 2014-07-21 LAB — D-DIMER, QUANTITATIVE (NOT AT ARMC): D DIMER QUANT: 0.33 ug{FEU}/mL (ref 0.00–0.48)

## 2014-07-21 LAB — I-STAT TROPONIN, ED: Troponin i, poc: 0 ng/mL (ref 0.00–0.08)

## 2014-07-21 MED ORDER — SODIUM CHLORIDE 0.9 % IV BOLUS (SEPSIS)
1000.0000 mL | Freq: Once | INTRAVENOUS | Status: AC
  Administered 2014-07-21: 1000 mL via INTRAVENOUS

## 2014-07-21 NOTE — Discharge Instructions (Signed)

## 2014-07-21 NOTE — ED Notes (Signed)
RN will draw Troponin from IV line

## 2014-07-21 NOTE — ED Provider Notes (Signed)
CSN: [id number]     Arrival date & time 07/21/14  1559 History   First MD Initiated Contact with Patient 07/21/14 1746     Chief Complaint  Patient presents with  . Chest Pain  . Numbness     (Consider location/radiation/quality/duration/timing/severity/associated sxs/prior Treatment) Patient is a 49 y.o. male presenting with chest pain. The history is provided by the patient. No language interpreter was used.  Chest Pain Pain location:  L chest and epigastric Pain quality: aching   Pain radiates to:  L arm and R arm Pain radiates to the back: no   Pain severity:  Moderate Onset quality:  Gradual Timing:  Intermittent Progression:  Resolved Chronicity:  New Relieved by:  Nothing Worsened by:  Nothing tried Ineffective treatments:  None tried Associated symptoms: no abdominal pain and no shortness of breath   Risk factors: no high cholesterol, no hypertension and no smoking   Pt reports he has had chest pain on and off for several days.  Pt reports some relief with tums other days.  Pt reports today at work he had pain and had numbness and tingling in his arms and legs. Pt traveled 2 months ago. No recent travel.  Pt denies any injury  Past Medical History  Diagnosis Date  . Wears glasses    Past Surgical History  Procedure Laterality Date  . No past surgeries    . Lipoma excision Right 06/03/2013    Procedure: EXCISION LIPOMA BACK;  Surgeon: Imogene Burn. Georgette Dover, MD;  Location: [place];  Service: General;  Laterality: Right;   History reviewed. No pertinent family history. History  Substance Use Topics  . Smoking status: Never Smoker   . Smokeless tobacco: Never Used  . Alcohol Use: No    Review of Systems  Respiratory: Negative for shortness of breath.   Cardiovascular: Positive for chest pain.  Gastrointestinal: Negative for abdominal pain.  All other systems reviewed and are negative.     Allergies  Review of patient's allergies indicates no  known allergies.  Home Medications   Prior to Admission medications   Medication Sig Start Date End Date Taking? Authorizing Provider  acetaminophen (TYLENOL) 500 MG tablet Take 1,000 mg by mouth once.   Yes Historical Provider, MD  B Complex Vitamins (VITAMIN B-COMPLEX PO) Take 1 tablet by mouth daily.   Yes Historical Provider, MD  cetirizine (ZYRTEC) 10 MG tablet Take 10 mg by mouth daily.   Yes Historical Provider, MD  Multiple Vitamin (MULTIVITAMIN WITH MINERALS) TABS tablet Take 1 tablet by mouth daily.   Yes Historical Provider, MD  VITAMIN E PO Take 1 capsule by mouth daily.   Yes Historical Provider, MD  VITAMIN E PO Take 1 capsule by mouth daily.   Yes Historical Provider, MD  doxycycline (VIBRAMYCIN) 100 MG capsule Take 1 pill daily starting 2 days before travel, daily while traveling, and daily for 28 days after you return Patient not taking: Reported on 07/21/2014 02/22/14   Bennett Scrape V, PA-C   BP 131/73 mmHg  Pulse 106  Temp(Src) 98.5 F (36.9 C) (Oral)  Resp 16  SpO2 97% Physical Exam  Constitutional: He is oriented to person, place, and time. He appears well-developed and well-nourished.  HENT:  Head: Normocephalic and atraumatic.  Right Ear: External ear normal.  Left Ear: External ear normal.  Nose: Nose normal.  Mouth/Throat: Oropharynx is clear and moist.  Eyes: Conjunctivae and EOM are normal. Pupils are equal, round, and reactive to light.  Neck: Normal range of motion. Neck supple.  Cardiovascular: Normal rate, regular rhythm and normal heart sounds.   Pulmonary/Chest: Effort normal and breath sounds normal.  Abdominal: He exhibits no distension.  Musculoskeletal: Normal range of motion.  Neurological: He is alert and oriented to person, place, and time.  Skin: Skin is warm.  Psychiatric: He has a normal mood and affect.  Nursing note and vitals reviewed.   ED Course  Procedures (including critical care time) Labs Review Labs Reviewed  BASIC  METABOLIC PANEL - Abnormal; Notable for the following:    Sodium 134 (*)    Glucose, Bld 124 (*)    Creatinine, Ser 1.51 (*)    GFR calc non Af Amer 54 (*)    GFR calc Af Amer 62 (*)    All other components within normal limits  CBC  BRAIN NATRIURETIC PEPTIDE  I-STAT TROPOININ, ED    Imaging Review Dg Chest 2 View  07/21/2014   CLINICAL DATA:  49 year old male with mid chest pain and numbness in bilateral arms, with pain radiating into the right side of the jaw. Syncope.  EXAM: CHEST  2 VIEW  COMPARISON:  Chest x-ray 02/19/2009.  FINDINGS: Lung volumes are low. No consolidative airspace disease. No pleural effusions. No pneumothorax. No pulmonary nodule or mass noted. Pulmonary vasculature and the cardiomediastinal silhouette are within normal limits.  IMPRESSION: Low lung volumes without radiographic evidence of acute cardiopulmonary disease.   Electronically Signed   By: Vinnie Langton M.D.   On: 07/21/2014 19:33     EKG Interpretation   Date/Time:  Friday July 21 2014 16:15:42 EDT Ventricular Rate:  109 PR Interval:  150 QRS Duration: 78 QT Interval:  292 QTC Calculation: 393 R Axis:   87 Text Interpretation:  Sinus tachycardia Left atrial enlargement  Nonspecific T abnormalities, inferior leads No old tracing to compare  Confirmed by Debby Freiberg [phone]) on 07/21/2014 7:58:33 PM      Results for orders placed or performed during the hospital encounter of 07/21/14  CBC  Result Value Ref Range   WBC 10.3 4.0 - 10.5 K/uL   RBC 4.74 4.22 - 5.81 MIL/uL   Hemoglobin 14.1 13.0 - 17.0 g/dL   HCT 41.2 39.0 - 52.0 %   MCV 86.9 78.0 - 100.0 fL   MCH 29.7 26.0 - 34.0 pg   MCHC 34.2 30.0 - 36.0 g/dL   RDW 12.4 11.5 - 15.5 %   Platelets 152 150 - 400 K/uL  Basic metabolic panel  Result Value Ref Range   Sodium 134 (L) 135 - 145 mmol/L   Potassium 3.6 3.5 - 5.1 mmol/L   Chloride 100 96 - 112 mmol/L   CO2 23 19 - 32 mmol/L   Glucose, Bld 124 (H) 70 - 99 mg/dL   BUN 16 6 -  23 mg/dL   Creatinine, Ser 1.51 (H) 0.50 - 1.35 mg/dL   Calcium 9.2 8.4 - 10.5 mg/dL   GFR calc non Af Amer 54 (L) >90 mL/min   GFR calc Af Amer 62 (L) >90 mL/min   Anion gap 11 5 - 15  BNP (order ONLY if patient complains of dyspnea/SOB AND you have documented it for THIS visit)  Result Value Ref Range   B Natriuretic Peptide 15.0 0.0 - 100.0 pg/mL  Troponin I  Result Value Ref Range   Troponin I <0.03 <0.031 ng/mL  D-dimer, quantitative  Result Value Ref Range   D-Dimer, Quant 0.33 0.00 - 0.48 ug/mL-FEU  I-stat  troponin, ED (not at [facility])  Result Value Ref Range   Troponin i, poc 0.00 0.00 - 0.08 ng/mL   Comment 3           Dg Chest 2 View  07/21/2014   CLINICAL DATA:  49 year old male with mid chest pain and numbness in bilateral arms, with pain radiating into the right side of the jaw. Syncope.  EXAM: CHEST  2 VIEW  COMPARISON:  Chest x-ray 02/19/2009.  FINDINGS: Lung volumes are low. No consolidative airspace disease. No pleural effusions. No pneumothorax. No pulmonary nodule or mass noted. Pulmonary vasculature and the cardiomediastinal silhouette are within normal limits.  IMPRESSION: Low lung volumes without radiographic evidence of acute cardiopulmonary disease.   Electronically Signed   By: Vinnie Langton M.D.   On: 07/21/2014 19:33      Dr. Colin Rhein in to see and examine.  ddimer negative, second troponin negative, pt is pain free.  Pt able eat no pain   Final diagnoses:  Chest pain    Pt advised follow up with primary care Md for recheck next week.      Hollace Kinnier Parker, PA-C 07/21/14 2128  Debby Freiberg, MD 07/23/14 [phone]

## 2014-07-21 NOTE — ED Notes (Signed)
MD at bedside. 

## 2014-07-21 NOTE — ED Notes (Signed)
Pt c/o chest pain x several days, worsening progressively, c/o numbness in feet and hands bilaterally. Denies SOB.

## 2014-12-28 ENCOUNTER — Encounter: Payer: Self-pay | Admitting: Physician Assistant

## 2014-12-28 DIAGNOSIS — N5319 Other ejaculatory dysfunction: Secondary | ICD-10-CM | POA: Insufficient documentation

## 2014-12-28 DIAGNOSIS — N529 Male erectile dysfunction, unspecified: Secondary | ICD-10-CM | POA: Insufficient documentation

## 2015-01-25 ENCOUNTER — Encounter: Payer: Self-pay | Admitting: Physician Assistant

## 2015-01-25 DIAGNOSIS — E785 Hyperlipidemia, unspecified: Secondary | ICD-10-CM

## 2015-01-25 DIAGNOSIS — N5319 Other ejaculatory dysfunction: Secondary | ICD-10-CM

## 2015-01-25 DIAGNOSIS — I10 Essential (primary) hypertension: Secondary | ICD-10-CM

## 2015-01-25 DIAGNOSIS — N529 Male erectile dysfunction, unspecified: Secondary | ICD-10-CM

## 2016-01-19 ENCOUNTER — Ambulatory Visit (INDEPENDENT_AMBULATORY_CARE_PROVIDER_SITE_OTHER): Payer: Managed Care, Other (non HMO) | Admitting: Family Medicine

## 2016-01-19 VITALS — BP 112/64 | HR 63 | Temp 98.3°F | Resp 16 | Ht 72.0 in | Wt 203.0 lb

## 2016-01-19 DIAGNOSIS — Z0000 Encounter for general adult medical examination without abnormal findings: Secondary | ICD-10-CM | POA: Diagnosis not present

## 2016-01-19 DIAGNOSIS — R05 Cough: Secondary | ICD-10-CM | POA: Diagnosis not present

## 2016-01-19 DIAGNOSIS — H538 Other visual disturbances: Secondary | ICD-10-CM

## 2016-01-19 DIAGNOSIS — F411 Generalized anxiety disorder: Secondary | ICD-10-CM | POA: Diagnosis not present

## 2016-01-19 DIAGNOSIS — R3911 Hesitancy of micturition: Secondary | ICD-10-CM

## 2016-01-19 DIAGNOSIS — E785 Hyperlipidemia, unspecified: Secondary | ICD-10-CM

## 2016-01-19 DIAGNOSIS — R739 Hyperglycemia, unspecified: Secondary | ICD-10-CM

## 2016-01-19 DIAGNOSIS — R059 Cough, unspecified: Secondary | ICD-10-CM

## 2016-01-19 DIAGNOSIS — R079 Chest pain, unspecified: Secondary | ICD-10-CM

## 2016-01-19 NOTE — Patient Instructions (Addendum)
For cough at night, this may be due to heartburn - try over the counter prilosec once per day. If cough persists - recheck in next few weeks.   See if information below helps anxiety symptoms. Follow up in few weeks to discuss further.   Return within the next 1 week for lab only visit. Make sure you're fasting for 8 hours prior to that blood test. Based on previous labs, you may need to be on a cholesterol medication, but we can determine that from your repeat testing.  Your chest pain does not sound to be heart related, but if any worsening of the symptoms return here or emergency room.  Follow up with me in the next few weeks to discuss some of these symptoms further, and if coughing or chest pain is not improved at that time, we can check a chest x-ray or discuss possible cardiology evaluation.   Return to the clinic or go to the nearest emergency room if any of your symptoms worsen or new symptoms occur.   Cough, Adult Coughing is a reflex that clears your throat and your airways. Coughing helps to heal and protect your lungs. It is normal to cough occasionally, but a cough that happens with other symptoms or lasts a long time may be a sign of a condition that needs treatment. A cough may last only 2-3 weeks (acute), or it may last longer than 8 weeks (chronic). CAUSES Coughing is commonly caused by:  Breathing in substances that irritate your lungs.  A viral or bacterial respiratory infection.  Allergies.  Asthma.  Postnasal drip.  Smoking.  Acid backing up from the stomach into the esophagus (gastroesophageal reflux).  Certain medicines.  Chronic lung problems, including COPD (or rarely, lung cancer).  Other medical conditions such as heart failure. HOME CARE INSTRUCTIONS  Pay attention to any changes in your symptoms. Take these actions to help with your discomfort:  Take medicines only as told by your health care provider.  If you were prescribed an antibiotic  medicine, take it as told by your health care provider. Do not stop taking the antibiotic even if you start to feel better.  Talk with your health care provider before you take a cough suppressant medicine.  Drink enough fluid to keep your urine clear or pale yellow.  If the air is dry, use a cold steam vaporizer or humidifier in your bedroom or your home to help loosen secretions.  Avoid anything that causes you to cough at work or at home.  If your cough is worse at night, try sleeping in a semi-upright position.  Avoid cigarette smoke. If you smoke, quit smoking. If you need help quitting, ask your health care provider.  Avoid caffeine.  Avoid alcohol.  Rest as needed. SEEK MEDICAL CARE IF:   You have new symptoms.  You cough up pus.  Your cough does not get better after 2-3 weeks, or your cough gets worse.  You cannot control your cough with suppressant medicines and you are losing sleep.  You develop pain that is getting worse or pain that is not controlled with pain medicines.  You have a fever.  You have unexplained weight loss.  You have night sweats. SEEK IMMEDIATE MEDICAL CARE IF:  You cough up blood.  You have difficulty breathing.  Your heartbeat is very fast.   This information is not intended to replace advice given to you by your health care provider. Make sure you discuss any questions you have  with your health care provider.   Document Released: 10/11/2010 Document Revised: 01/03/2015 Document Reviewed: 06/21/2014 Elsevier Interactive Patient Education 2016 [place] and Stress Management Stress is a normal reaction to life events. It is what you feel when life demands more than you are used to or more than you can handle. Some stress can be useful. For example, the stress reaction can help you catch the last bus of the day, study for a test, or meet a deadline at work. But stress that occurs too often or for too long can cause  problems. It can affect your emotional health and interfere with relationships and normal daily activities. Too much stress can weaken your immune system and increase your risk for physical illness. If you already have a medical problem, stress can make it worse. CAUSES  All sorts of life events may cause stress. An event that causes stress for one person may not be stressful for another person. Major life events commonly cause stress. These may be positive or negative. Examples include losing your job, moving into a new home, getting married, having a baby, or losing a loved one. Less obvious life events may also cause stress, especially if they occur day after day or in combination. Examples include working long hours, driving in traffic, caring for children, being in debt, or being in a difficult relationship. SIGNS AND SYMPTOMS Stress may cause emotional symptoms including, the following:  Anxiety. This is feeling worried, afraid, on edge, overwhelmed, or out of control.  Anger. This is feeling irritated or impatient.  Depression. This is feeling sad, down, helpless, or guilty.  Difficulty focusing, remembering, or making decisions. Stress may cause physical symptoms, including the following:   Aches and pains. These may affect your head, neck, back, stomach, or other areas of your body.  Tight muscles or clenched jaw.  Low energy or trouble sleeping. Stress may cause unhealthy behaviors, including the following:   Eating to feel better (overeating) or skipping meals.  Sleeping too little, too much, or both.  Working too much or putting off tasks (procrastination).  Smoking, drinking alcohol, or using drugs to feel better. DIAGNOSIS  Stress is diagnosed through an assessment by your health care provider. Your health care provider will ask questions about your symptoms and any stressful life events.Your health care provider will also ask about your medical history and may order  blood tests or other tests. Certain medical conditions and medicine can cause physical symptoms similar to stress. Mental illness can cause emotional symptoms and unhealthy behaviors similar to stress. Your health care provider may refer you to a mental health professional for further evaluation.  TREATMENT  Stress management is the recommended treatment for stress.The goals of stress management are reducing stressful life events and coping with stress in healthy ways.  Techniques for reducing stressful life events include the following:  Stress identification. Self-monitor for stress and identify what causes stress for you. These skills may help you to avoid some stressful events.  Time management. Set your priorities, keep a calendar of events, and learn to say "no." These tools can help you avoid making too many commitments. Techniques for coping with stress include the following:  Rethinking the problem. Try to think realistically about stressful events rather than ignoring them or overreacting. Try to find the positives in a stressful situation rather than focusing on the negatives.  Exercise. Physical exercise can release both physical and emotional tension. The key is to find a  form of exercise you enjoy and do it regularly.  Relaxation techniques. These relax the body and mind. Examples include yoga, meditation, tai chi, biofeedback, deep breathing, progressive muscle relaxation, listening to music, being out in nature, journaling, and other hobbies. Again, the key is to find one or more that you enjoy and can do regularly.  Healthy lifestyle. Eat a balanced diet, get plenty of sleep, and do not smoke. Avoid using alcohol or drugs to relax.  Strong support network. Spend time with family, friends, or other people you enjoy being around.Express your feelings and talk things over with someone you trust. Counseling or talktherapy with a mental health professional may be helpful if you are  having difficulty managing stress on your own. Medicine is typically not recommended for the treatment of stress.Talk to your health care provider if you think you need medicine for symptoms of stress. HOME CARE INSTRUCTIONS  Keep all follow-up visits as directed by your health care provider.  Take all medicines as directed by your health care provider. SEEK MEDICAL CARE IF:  Your symptoms get worse or you start having new symptoms.  You feel overwhelmed by your problems and can no longer manage them on your own. SEEK IMMEDIATE MEDICAL CARE IF:  You feel like hurting yourself or someone else.   This information is not intended to replace advice given to you by your health care provider. Make sure you discuss any questions you have with your health care provider.   Document Released: 10/08/2000 Document Revised: 05/05/2014 Document Reviewed: 12/07/2012 Elsevier Interactive Patient Education 2016 [employer]   Nonspecific Chest Pain  Chest pain can be caused by many different conditions. There is always a chance that your pain could be related to something serious, such as a heart attack or a blood clot in your lungs. Chest pain can also be caused by conditions that are not life-threatening. If you have chest pain, it is very important to follow up with your health care provider. CAUSES  Chest pain can be caused by:  Heartburn.  Pneumonia or bronchitis.  Anxiety or stress.  Inflammation around your heart (pericarditis) or lung (pleuritis or pleurisy).  A blood clot in your lung.  A collapsed lung (pneumothorax). It can develop suddenly on its own (spontaneous pneumothorax) or from trauma to the chest.  Shingles infection (varicella-zoster virus).  Heart attack.  Damage to the bones, muscles, and cartilage that make up your chest wall. This can include:  Bruised bones due to injury.  Strained muscles or cartilage due to frequent or repeated coughing or  overwork.  Fracture to one or more ribs.  Sore cartilage due to inflammation (costochondritis). RISK FACTORS  Risk factors for chest pain may include:  Activities that increase your risk for trauma or injury to your chest.  Respiratory infections or conditions that cause frequent coughing.  Medical conditions or overeating that can cause heartburn.  Heart disease or family history of heart disease.  Conditions or health behaviors that increase your risk of developing a blood clot.  Having had chicken pox (varicella zoster). SIGNS AND SYMPTOMS Chest pain can feel like:  Burning or tingling on the surface of your chest or deep in your chest.  Crushing, pressure, aching, or squeezing pain.  Dull or sharp pain that is worse when you move, cough, or take a deep breath.  Pain that is also felt in your back, neck, shoulder, or arm, or pain that spreads to any of these areas. Your chest pain  may come and go, or it may stay constant. DIAGNOSIS Lab tests or other studies may be needed to find the cause of your pain. Your health care provider may have you take a test called an ambulatory ECG (electrocardiogram). An ECG records your heartbeat patterns at the time the test is performed. You may also have other tests, such as:  Transthoracic echocardiogram (TTE). During echocardiography, sound waves are used to create a picture of all of the heart structures and to look at how blood flows through your heart.  Transesophageal echocardiogram (TEE).This is a more advanced imaging test that obtains images from inside your body. It allows your health care provider to see your heart in finer detail.  Cardiac monitoring. This allows your health care provider to monitor your heart rate and rhythm in real time.  Holter monitor. This is a portable device that records your heartbeat and can help to diagnose abnormal heartbeats. It allows your health care provider to track your heart activity for  several days, if needed.  Stress tests. These can be done through exercise or by taking medicine that makes your heart beat more quickly.  Blood tests.  Imaging tests. TREATMENT  Your treatment depends on what is causing your chest pain. Treatment may include:  Medicines. These may include:  Acid blockers for heartburn.  Anti-inflammatory medicine.  Pain medicine for inflammatory conditions.  Antibiotic medicine, if an infection is present.  Medicines to dissolve blood clots.  Medicines to treat coronary artery disease.  Supportive care for conditions that do not require medicines. This may include:  Resting.  Applying heat or cold packs to injured areas.  Limiting activities until pain decreases. HOME CARE INSTRUCTIONS  If you were prescribed an antibiotic medicine, finish it all even if you start to feel better.  Avoid any activities that bring on chest pain.  Do not use any tobacco products, including cigarettes, chewing tobacco, or electronic cigarettes. If you need help quitting, ask your health care provider.  Do not drink alcohol.  Take medicines only as directed by your health care provider.  Keep all follow-up visits as directed by your health care provider. This is important. This includes any further testing if your chest pain does not go away.  If heartburn is the cause for your chest pain, you may be told to keep your head raised (elevated) while sleeping. This reduces the chance that acid will go from your stomach into your esophagus.  Make lifestyle changes as directed by your health care provider. These may include:  Getting regular exercise. Ask your health care provider to suggest some activities that are safe for you.  Eating a heart-healthy diet. A registered dietitian can help you to learn healthy eating options.  Maintaining a healthy weight.  Managing diabetes, if necessary.  Reducing stress. SEEK MEDICAL CARE IF:  Your chest pain  does not go away after treatment.  You have a rash with blisters on your chest.  You have a fever. SEEK IMMEDIATE MEDICAL CARE IF:   Your chest pain is worse.  You have an increasing cough, or you cough up blood.  You have severe abdominal pain.  You have severe weakness.  You faint.  You have chills.  You have sudden, unexplained chest discomfort.  You have sudden, unexplained discomfort in your arms, back, neck, or jaw.  You have shortness of breath at any time.  You suddenly start to sweat, or your skin gets clammy.  You feel nauseous or you vomit.  You suddenly feel light-headed or dizzy.  Your heart begins to beat quickly, or it feels like it is skipping beats. These symptoms may represent a serious problem that is an emergency. Do not wait to see if the symptoms will go away. Get medical help right away. Call your local emergency services (911 in the [place] Do not drive yourself to the hospital.   This information is not intended to replace advice given to you by your health care provider. Make sure you discuss any questions you have with your health care provider.   Document Released: 01/22/2005 Document Revised: 05/05/2014 Document Reviewed: 11/18/2013 Elsevier Interactive Patient Education 2016 [place] you healthy  Get these tests  Blood pressure- Have your blood pressure checked once a year by your healthcare provider.  Normal blood pressure is 120/80.  Weight- Have your body mass index (BMI) calculated to screen for obesity.  BMI is a measure of body fat based on height and weight. You can also calculate your own BMI at [employer].  Cholesterol- Have your cholesterol checked regularly starting at age 25, sooner may be necessary if you have diabetes, high blood pressure, if a family member developed heart diseases at an early age or if you smoke.   Chlamydia, HIV, and other sexual transmitted disease- Get screened each year  until the age of 36 then within three months of each new sexual partner.  Diabetes- Have your blood sugar checked regularly if you have high blood pressure, high cholesterol, a family history of diabetes or if you are overweight.  Get these vaccines  Flu shot- Every fall.  Tetanus shot- Every 10 years.  Menactra- Single dose; prevents meningitis.  Take these steps  Don't smoke- If you do smoke, ask your healthcare provider about quitting. For tips on how to quit, go to www.smokefree.gov or call 1-800-QUIT-NOW.  Be physically active- Exercise 5 days a week for at least 30 minutes.  If you are not already physically active start slow and gradually work up to 30 minutes of moderate physical activity.  Examples of moderate activity include walking briskly, mowing the yard, dancing, swimming bicycling, etc.  Eat a healthy diet- Eat a variety of healthy foods such as fruits, vegetables, low fat milk, low fat cheese, yogurt, lean meats, poultry, fish, beans, tofu, etc.  For more information on healthy eating, go to www.thenutritionsource.org  Drink alcohol in moderation- Limit alcohol intake two drinks or less a day.  Never drink and drive.  Dentist- Brush and floss teeth twice daily; visit your dentis twice a year.  Depression-Your emotional health is as important as your physical health.  If you're feeling down, losing interest in things you normally enjoy please talk with your healthcare provider.  Gun Safety- If you keep a gun in your home, keep it unloaded and with the safety lock on.  Bullets should be stored separately.  Helmet use- Always wear a helmet when riding a motorcycle, bicycle, rollerblading or skateboarding.  Safe sex- If you may be exposed to a sexually transmitted infection, use a condom  Seat belts- Seat bels can save your life; always wear one.  Smoke/Carbon Monoxide detectors- These detectors need to be installed on the appropriate level of your home.  Replace  batteries at least once a year.  Skin Cancer- When out in the sun, cover up and use sunscreen SPF 15 or higher.  Violence- If anyone is threatening or hurting you, please tell your healthcare provider.  IF you received  an x-ray today, you will receive an invoice from [facility]. Please contact [facility] at [phone] with questions or concerns regarding your invoice.   IF you received labwork today, you will receive an invoice from [employer]. Please contact [facility] at [phone] with questions or concerns regarding your invoice.   Our billing staff will not be able to assist you with questions regarding bills from these companies.  You will be contacted with the lab results as soon as they are available. The fastest way to get your results is to activate your My Chart account. Instructions are located on the last page of this paperwork. If you have not heard from [place] regarding the results in 2 weeks, please contact this office.

## 2016-01-19 NOTE — Progress Notes (Signed)
Subjective:    Patient ID: Adam Cortez, male    DOB: [birth year], 50 y.o.   MRN: [id number] Chief Complaint  Patient presents with  . Annual Exam    HPI Adam Cortez is a 50 y.o. male  Here for annual exam. History of hyperlipidemia, hypertension, erectile dysfunction.   Hypertension  - Noted in past. Not currently on medication.Seen in March 2016 in the ER for chest pain, negative d-dimer 2 negative troponins, and pain-free at that time. Advised to follow-up with primary care provider.  Chest pain still occurs at times - quickly resolves within a few seconds. Anxiety at times past few months - about every day per wife. Has been busier at work. Outside blood pressure 143/86 at screening visit.   Waking up coughing at night - past few months.  No shortness of breath. No unexplained wt loss.  Denies heartburn. No wheezing, no hx of asthma. Not associated with CP.   Trouble starting urination at times, past 5-6 months.no changes. Episodic.   Lab Results  Component Value Date   CREATININE 1.51 (H) 07/21/2014    Hyperlipidemia  - Not on medications currently. Had bloodwork with insurance and was told levels ok recently: HDL of 54, triglycerides 77, LDL 195, total 269. not fasting now.  Lab Results  Component Value Date   CHOL 205 (H) 04/05/2013   HDL 45 04/05/2013   LDLCALC 145 (H) 04/05/2013   TRIG 77 04/05/2013   CHOLHDL 4.6 04/05/2013   Lab Results  Component Value Date   ALT 13 04/05/2013   AST 19 04/05/2013   ALKPHOS 39 04/05/2013   BILITOT 0.5 04/05/2013   Cancer screening Has seen Dr. Gaynelle Arabian in past. Agrees to prostate cancer screening.    No results found for: PSA  Immunizations Immunization History  Administered Date(s) Administered  . Influenza, Seasonal, Injecte, Preservative Fre 03/26/2012  . Influenza,inj,Quad PF,36+ Mos 02/22/2014  . Meningococcal Conjugate 03/26/2012  tetanus: unknown but within 10 years.  Flu vaccine: today.   Depression  screening: Depression screen [facility] 2/9 01/19/2016  Decreased Interest 0  Down, Depressed, Hopeless 0  PHQ - 2 Score 0  anxiety sx's as above. Denies depression, NO SI/HI. No alcohol, no IDU.   Vision screening:blurry vision comes and goes for months. No recent worsening. Last optho visit few years ago.   Visual Acuity Screening   Right eye Left eye Both eyes  Without correction:     With correction: 20/20 20/25 20/15    Hyperglycemia   - had screening tests for insurance last week and blood sugar was elevated at    No known hx of DM. Glucose was 103  Dentist: No recent eval.   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 Lately due to work unable to exercise outside of work.  Usually runs in afternoon and weekend. No chest pain with exercise.    Patient Active Problem List   Diagnosis Date Noted  . Hyperlipidemia 01/25/2015  . Benign essential HTN 01/25/2015  . Erectile dysfunction 12/28/2014  . Anejaculation 12/28/2014  . 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 Allergy   . Wears glasses    Past Surgical History:  Procedure Laterality Date  . LIPOMA EXCISION Right 06/03/2013   Procedure: EXCISION LIPOMA BACK;  Surgeon: Imogene Burn. Georgette Dover, MD;  Location: [place];  Service: General;  Laterality: Right;  . NO PAST SURGERIES     No Known Allergies Prior to  Admission medications   Medication Sig Start Date End Date Taking? Authorizing Provider  acetaminophen (TYLENOL) 500 MG tablet Take 1,000 mg by mouth once.   Yes Historical Provider, MD   Social History   Social History  . Marital status: Married    Spouse name: [name]  . Number of children: N/A  . Years of education: N/A   Occupational History  . Not on file.   Social History Main Topics  . Smoking status: Never Smoker  . Smokeless tobacco: Never Used  . Alcohol use No  . Drug use: No  . Sexual activity: Yes   Other Topics Concern  . Not on file   Social  History Narrative   From [place].      Review of Systems  Eyes: Positive for visual disturbance (blurry).  Respiratory: Positive for cough.   Cardiovascular: Positive for chest pain (episodic).  Genitourinary: Positive for difficulty urinating.  Allergic/Immunologic: Positive for environmental allergies.  Psychiatric/Behavioral: Positive for sleep disturbance. The patient is nervous/anxious.    13 point ROS as above, otherwise negative.    Objective:   Physical Exam  Constitutional: He is oriented to person, place, and time. He appears well-developed and well-nourished.  HENT:  Head: Normocephalic and atraumatic.  Right Ear: External ear normal.  Left Ear: External ear normal.  Mouth/Throat: Oropharynx is clear and moist.  Eyes: Conjunctivae and EOM are normal. Pupils are equal, round, and reactive to light.  Neck: Normal range of motion. Neck supple. No thyromegaly present.  Cardiovascular: Normal rate, regular rhythm, normal heart sounds and intact distal pulses.   Pulmonary/Chest: Effort normal and breath sounds normal. No respiratory distress. He has no wheezes.  Abdominal: Soft. He exhibits no distension. There is no tenderness. Hernia confirmed negative in the right inguinal area and confirmed negative in the left inguinal area.  Genitourinary: Prostate normal.  Musculoskeletal: Normal range of motion. He exhibits no edema or tenderness.  Lymphadenopathy:    He has no cervical adenopathy.  Neurological: He is alert and oriented to person, place, and time. He has normal reflexes.  Skin: Skin is warm and dry.  Psychiatric: He has a normal mood and affect. His behavior is normal.  Vitals reviewed.  Vitals:   01/19/16 1447  BP: 112/64  Pulse: 63  Resp: 16  Temp: 98.3 F (36.8 C)  TempSrc: Oral  SpO2: 98%  Weight: 203 lb (92.1 kg)  Height: 6' (1.829 m)     EKG: Sinus rhythm, rate 55, no acute findings.     Assessment & Plan:   Adam Cortez is a 50 y.o.  male Annual physical exam  --anticipatory guidance as below in AVS, screening labs above. Health maintenance items as above in HPI discussed/recommended as applicable.   Hyperlipidemia - Plan: COMPLETE METABOLIC PANEL WITH GFR, Lipid panel  -Check lipid panel, based on outside readings may be a date for statin. Will wait on new readings to decide  Hyperglycemia - Plan: COMPLETE METABOLIC PANEL WITH GFR, Hemoglobin A1C  -mildly Elevated outside of office, check A1c.  Cough  - Based on lying down at night, may be reflux. Trial of PPI, and recheck in next few weeks if not significantly improved for CXR and other eval as needed. Sooner if worse.  Anxiety state  - Intermittent, may be due to stress at work. Stress and stress management handout given, return to discuss the symptoms further in the next few weeks.  Blurry vision  - No concerns seen on vision in office,  advised to contact his eye care provider ASAP for evaluation. RTC/ER precautions if worsening.  Chest pain, unspecified chest pain type - Plan: EKG 12-Lead  - Rare, not concerning on history, and exam reassuring. EKG reassuring. ER/911 chest pain precautions reviewed, and return to discuss further. Heartburn may be related to the symptoms, as well as stress.  Urinary hesitancy  - No apparent nodules noted on prostate, will check PSA, and possible urology eval.  No orders of the defined types were placed in this encounter.  Patient Instructions   For cough at night, this may be due to heartburn - try over the counter prilosec once per day. If cough persists - recheck in next few weeks.   See if information below helps anxiety symptoms. Follow up in few weeks to discuss further.   Return within the next 1 week for lab only visit. Make sure you're fasting for 8 hours prior to that blood test. Based on previous labs, you may need to be on a cholesterol medication, but we can determine that from your repeat testing.  Your chest  pain does not sound to be heart related, but if any worsening of the symptoms return here or emergency room.  Follow up with me in the next few weeks to discuss some of these symptoms further, and if coughing or chest pain is not improved at that time, we can check a chest x-ray or discuss possible cardiology evaluation.   Return to the clinic or go to the nearest emergency room if any of your symptoms worsen or new symptoms occur.   Cough, Adult Coughing is a reflex that clears your throat and your airways. Coughing helps to heal and protect your lungs. It is normal to cough occasionally, but a cough that happens with other symptoms or lasts a long time may be a sign of a condition that needs treatment. A cough may last only 2-3 weeks (acute), or it may last longer than 8 weeks (chronic). CAUSES Coughing is commonly caused by:  Breathing in substances that irritate your lungs.  A viral or bacterial respiratory infection.  Allergies.  Asthma.  Postnasal drip.  Smoking.  Acid backing up from the stomach into the esophagus (gastroesophageal reflux).  Certain medicines.  Chronic lung problems, including COPD (or rarely, lung cancer).  Other medical conditions such as heart failure. HOME CARE INSTRUCTIONS  Pay attention to any changes in your symptoms. Take these actions to help with your discomfort:  Take medicines only as told by your health care provider.  If you were prescribed an antibiotic medicine, take it as told by your health care provider. Do not stop taking the antibiotic even if you start to feel better.  Talk with your health care provider before you take a cough suppressant medicine.  Drink enough fluid to keep your urine clear or pale yellow.  If the air is dry, use a cold steam vaporizer or humidifier in your bedroom or your home to help loosen secretions.  Avoid anything that causes you to cough at work or at home.  If your cough is worse at night, try  sleeping in a semi-upright position.  Avoid cigarette smoke. If you smoke, quit smoking. If you need help quitting, ask your health care provider.  Avoid caffeine.  Avoid alcohol.  Rest as needed. SEEK MEDICAL CARE IF:   You have new symptoms.  You cough up pus.  Your cough does not get better after 2-3 weeks, or your cough gets worse.  You cannot control your cough with suppressant medicines and you are losing sleep.  You develop pain that is getting worse or pain that is not controlled with pain medicines.  You have a fever.  You have unexplained weight loss.  You have night sweats. SEEK IMMEDIATE MEDICAL CARE IF:  You cough up blood.  You have difficulty breathing.  Your heartbeat is very fast.   This information is not intended to replace advice given to you by your health care provider. Make sure you discuss any questions you have with your health care provider.   Document Released: 10/11/2010 Document Revised: 01/03/2015 Document Reviewed: 06/21/2014 Elsevier Interactive Patient Education 2016 [place] and Stress Management Stress is a normal reaction to life events. It is what you feel when life demands more than you are used to or more than you can handle. Some stress can be useful. For example, the stress reaction can help you catch the last bus of the day, study for a test, or meet a deadline at work. But stress that occurs too often or for too long can cause problems. It can affect your emotional health and interfere with relationships and normal daily activities. Too much stress can weaken your immune system and increase your risk for physical illness. If you already have a medical problem, stress can make it worse. CAUSES  All sorts of life events may cause stress. An event that causes stress for one person may not be stressful for another person. Major life events commonly cause stress. These may be positive or negative. Examples include losing  your job, moving into a new home, getting married, having a baby, or losing a loved one. Less obvious life events may also cause stress, especially if they occur day after day or in combination. Examples include working long hours, driving in traffic, caring for children, being in debt, or being in a difficult relationship. SIGNS AND SYMPTOMS Stress may cause emotional symptoms including, the following:  Anxiety. This is feeling worried, afraid, on edge, overwhelmed, or out of control.  Anger. This is feeling irritated or impatient.  Depression. This is feeling sad, down, helpless, or guilty.  Difficulty focusing, remembering, or making decisions. Stress may cause physical symptoms, including the following:   Aches and pains. These may affect your head, neck, back, stomach, or other areas of your body.  Tight muscles or clenched jaw.  Low energy or trouble sleeping. Stress may cause unhealthy behaviors, including the following:   Eating to feel better (overeating) or skipping meals.  Sleeping too little, too much, or both.  Working too much or putting off tasks (procrastination).  Smoking, drinking alcohol, or using drugs to feel better. DIAGNOSIS  Stress is diagnosed through an assessment by your health care provider. Your health care provider will ask questions about your symptoms and any stressful life events.Your health care provider will also ask about your medical history and may order blood tests or other tests. Certain medical conditions and medicine can cause physical symptoms similar to stress. Mental illness can cause emotional symptoms and unhealthy behaviors similar to stress. Your health care provider may refer you to a mental health professional for further evaluation.  TREATMENT  Stress management is the recommended treatment for stress.The goals of stress management are reducing stressful life events and coping with stress in healthy ways.  Techniques for reducing  stressful life events include the following:  Stress identification. Self-monitor for stress and identify what causes stress for you.  These skills may help you to avoid some stressful events.  Time management. Set your priorities, keep a calendar of events, and learn to say "no." These tools can help you avoid making too many commitments. Techniques for coping with stress include the following:  Rethinking the problem. Try to think realistically about stressful events rather than ignoring them or overreacting. Try to find the positives in a stressful situation rather than focusing on the negatives.  Exercise. Physical exercise can release both physical and emotional tension. The key is to find a form of exercise you enjoy and do it regularly.  Relaxation techniques. These relax the body and mind. Examples include yoga, meditation, tai chi, biofeedback, deep breathing, progressive muscle relaxation, listening to music, being out in nature, journaling, and other hobbies. Again, the key is to find one or more that you enjoy and can do regularly.  Healthy lifestyle. Eat a balanced diet, get plenty of sleep, and do not smoke. Avoid using alcohol or drugs to relax.  Strong support network. Spend time with family, friends, or other people you enjoy being around.Express your feelings and talk things over with someone you trust. Counseling or talktherapy with a mental health professional may be helpful if you are having difficulty managing stress on your own. Medicine is typically not recommended for the treatment of stress.Talk to your health care provider if you think you need medicine for symptoms of stress. HOME CARE INSTRUCTIONS  Keep all follow-up visits as directed by your health care provider.  Take all medicines as directed by your health care provider. SEEK MEDICAL CARE IF:  Your symptoms get worse or you start having new symptoms.  You feel overwhelmed by your problems and can no  longer manage them on your own. SEEK IMMEDIATE MEDICAL CARE IF:  You feel like hurting yourself or someone else.   This information is not intended to replace advice given to you by your health care provider. Make sure you discuss any questions you have with your health care provider.   Document Released: 10/08/2000 Document Revised: 05/05/2014 Document Reviewed: 12/07/2012 Elsevier Interactive Patient Education 2016 Elsevier Inc[employer]   Nonspecific Chest Pain  Chest pain can be caused by many different conditions. There is always a chance that your pain could be related to something serious, such as a heart attack or a blood clot in your lungs. Chest pain can also be caused by conditions that are not life-threatening. If you have chest pain, it is very important to follow up with your health care provider. CAUSES  Chest pain can be caused by:  Heartburn.  Pneumonia or bronchitis.  Anxiety or stress.  Inflammation around your heart (pericarditis) or lung (pleuritis or pleurisy).  A blood clot in your lung.  A collapsed lung (pneumothorax). It can develop suddenly on its own (spontaneous pneumothorax) or from trauma to the chest.  Shingles infection (varicella-zoster virus).  Heart attack.  Damage to the bones, muscles, and cartilage that make up your chest wall. This can include:  Bruised bones due to injury.  Strained muscles or cartilage due to frequent or repeated coughing or overwork.  Fracture to one or more ribs.  Sore cartilage due to inflammation (costochondritis). RISK FACTORS  Risk factors for chest pain may include:  Activities that increase your risk for trauma or injury to your chest.  Respiratory infections or conditions that cause frequent coughing.  Medical conditions or overeating that can cause heartburn.  Heart disease or family history of heart disease.  Conditions or health behaviors that increase your risk of developing a blood clot.  Having had  chicken pox (varicella zoster). SIGNS AND SYMPTOMS Chest pain can feel like:  Burning or tingling on the surface of your chest or deep in your chest.  Crushing, pressure, aching, or squeezing pain.  Dull or sharp pain that is worse when you move, cough, or take a deep breath.  Pain that is also felt in your back, neck, shoulder, or arm, or pain that spreads to any of these areas. Your chest pain may come and go, or it may stay constant. DIAGNOSIS Lab tests or other studies may be needed to find the cause of your pain. Your health care provider may have you take a test called an ambulatory ECG (electrocardiogram). An ECG records your heartbeat patterns at the time the test is performed. You may also have other tests, such as:  Transthoracic echocardiogram (TTE). During echocardiography, sound waves are used to create a picture of all of the heart structures and to look at how blood flows through your heart.  Transesophageal echocardiogram (TEE).This is a more advanced imaging test that obtains images from inside your body. It allows your health care provider to see your heart in finer detail.  Cardiac monitoring. This allows your health care provider to monitor your heart rate and rhythm in real time.  Holter monitor. This is a portable device that records your heartbeat and can help to diagnose abnormal heartbeats. It allows your health care provider to track your heart activity for several days, if needed.  Stress tests. These can be done through exercise or by taking medicine that makes your heart beat more quickly.  Blood tests.  Imaging tests. TREATMENT  Your treatment depends on what is causing your chest pain. Treatment may include:  Medicines. These may include:  Acid blockers for heartburn.  Anti-inflammatory medicine.  Pain medicine for inflammatory conditions.  Antibiotic medicine, if an infection is present.  Medicines to dissolve blood clots.  Medicines to  treat coronary artery disease.  Supportive care for conditions that do not require medicines. This may include:  Resting.  Applying heat or cold packs to injured areas.  Limiting activities until pain decreases. HOME CARE INSTRUCTIONS  If you were prescribed an antibiotic medicine, finish it all even if you start to feel better.  Avoid any activities that bring on chest pain.  Do not use any tobacco products, including cigarettes, chewing tobacco, or electronic cigarettes. If you need help quitting, ask your health care provider.  Do not drink alcohol.  Take medicines only as directed by your health care provider.  Keep all follow-up visits as directed by your health care provider. This is important. This includes any further testing if your chest pain does not go away.  If heartburn is the cause for your chest pain, you may be told to keep your head raised (elevated) while sleeping. This reduces the chance that acid will go from your stomach into your esophagus.  Make lifestyle changes as directed by your health care provider. These may include:  Getting regular exercise. Ask your health care provider to suggest some activities that are safe for you.  Eating a heart-healthy diet. A registered dietitian can help you to learn healthy eating options.  Maintaining a healthy weight.  Managing diabetes, if necessary.  Reducing stress. SEEK MEDICAL CARE IF:  Your chest pain does not go away after treatment.  You have a rash with blisters on your chest.  You have a fever. SEEK IMMEDIATE MEDICAL CARE IF:   Your chest pain is worse.  You have an increasing cough, or you cough up blood.  You have severe abdominal pain.  You have severe weakness.  You faint.  You have chills.  You have sudden, unexplained chest discomfort.  You have sudden, unexplained discomfort in your arms, back, neck, or jaw.  You have shortness of breath at any time.  You suddenly start to  sweat, or your skin gets clammy.  You feel nauseous or you vomit.  You suddenly feel light-headed or dizzy.  Your heart begins to beat quickly, or it feels like it is skipping beats. These symptoms may represent a serious problem that is an emergency. Do not wait to see if the symptoms will go away. Get medical help right away. Call your local emergency services (911 in the [place] Do not drive yourself to the hospital.   This information is not intended to replace advice given to you by your health care provider. Make sure you discuss any questions you have with your health care provider.   Document Released: 01/22/2005 Document Revised: 05/05/2014 Document Reviewed: 11/18/2013 Elsevier Interactive Patient Education 2016 [place] you healthy  Get these tests  Blood pressure- Have your blood pressure checked once a year by your healthcare provider.  Normal blood pressure is 120/80.  Weight- Have your body mass index (BMI) calculated to screen for obesity.  BMI is a measure of body fat based on height and weight. You can also calculate your own BMI at [employer].  Cholesterol- Have your cholesterol checked regularly starting at age 53, sooner may be necessary if you have diabetes, high blood pressure, if a family member developed heart diseases at an early age or if you smoke.   Chlamydia, HIV, and other sexual transmitted disease- Get screened each year until the age of 72 then within three months of each new sexual partner.  Diabetes- Have your blood sugar checked regularly if you have high blood pressure, high cholesterol, a family history of diabetes or if you are overweight.  Get these vaccines  Flu shot- Every fall.  Tetanus shot- Every 10 years.  Menactra- Single dose; prevents meningitis.  Take these steps  Don't smoke- If you do smoke, ask your healthcare provider about quitting. For tips on how to quit, go to www.smokefree.gov or call  1-800-QUIT-NOW.  Be physically active- Exercise 5 days a week for at least 30 minutes.  If you are not already physically active start slow and gradually work up to 30 minutes of moderate physical activity.  Examples of moderate activity include walking briskly, mowing the yard, dancing, swimming bicycling, etc.  Eat a healthy diet- Eat a variety of healthy foods such as fruits, vegetables, low fat milk, low fat cheese, yogurt, lean meats, poultry, fish, beans, tofu, etc.  For more information on healthy eating, go to www.thenutritionsource.org  Drink alcohol in moderation- Limit alcohol intake two drinks or less a day.  Never drink and drive.  Dentist- Brush and floss teeth twice daily; visit your dentis twice a year.  Depression-Your emotional health is as important as your physical health.  If you're feeling down, losing interest in things you normally enjoy please talk with your healthcare provider.  Gun Safety- If you keep a gun in your home, keep it unloaded and with the safety lock on.  Bullets should be stored separately.  Helmet use- Always wear a helmet when riding a  motorcycle, bicycle, rollerblading or skateboarding.  Safe sex- If you may be exposed to a sexually transmitted infection, use a condom  Seat belts- Seat bels can save your life; always wear one.  Smoke/Carbon Monoxide detectors- These detectors need to be installed on the appropriate level of your home.  Replace batteries at least once a year.  Skin Cancer- When out in the sun, cover up and use sunscreen SPF 15 or higher.  Violence- If anyone is threatening or hurting you, please tell your healthcare provider.  IF you received an x-ray today, you will receive an invoice from [facility]. Please contact [facility] at [phone] with questions or concerns regarding your invoice.   IF you received labwork today, you will receive an invoice from [employer]. Please  contact [facility] at [phone] with questions or concerns regarding your invoice.   Our billing staff will not be able to assist you with questions regarding bills from these companies.  You will be contacted with the lab results as soon as they are available. The fastest way to get your results is to activate your My Chart account. Instructions are located on the last page of this paperwork. If you have not heard from [place] regarding the results in 2 weeks, please contact this office.       Signed,   [name], MD [facility].  01/20/16 10:49 PM

## 2016-02-09 ENCOUNTER — Other Ambulatory Visit (INDEPENDENT_AMBULATORY_CARE_PROVIDER_SITE_OTHER): Payer: Managed Care, Other (non HMO) | Admitting: Family Medicine

## 2016-02-09 DIAGNOSIS — E785 Hyperlipidemia, unspecified: Secondary | ICD-10-CM

## 2016-02-09 DIAGNOSIS — R739 Hyperglycemia, unspecified: Secondary | ICD-10-CM

## 2016-02-09 DIAGNOSIS — Z23 Encounter for immunization: Secondary | ICD-10-CM

## 2016-02-09 LAB — COMPLETE METABOLIC PANEL WITH GFR
ALK PHOS: 36 U/L — AB (ref 40–115)
ALT: 13 U/L (ref 9–46)
AST: 18 U/L (ref 10–40)
Albumin: 4.4 g/dL (ref 3.6–5.1)
BUN: 14 mg/dL (ref 7–25)
CALCIUM: 9.6 mg/dL (ref 8.6–10.3)
CO2: 28 mmol/L (ref 20–31)
Chloride: 101 mmol/L (ref 98–110)
Creat: 1.55 mg/dL — ABNORMAL HIGH (ref 0.60–1.35)
GFR, EST NON AFRICAN AMERICAN: 52 mL/min — AB (ref >=60)
GFR, Est African American: 60 mL/min (ref >=60)
Glucose, Bld: 92 mg/dL (ref 65–99)
Potassium: 4.1 mmol/L (ref 3.5–5.3)
Sodium: 139 mmol/L (ref 135–146)
Total Bilirubin: 0.5 mg/dL (ref 0.2–1.2)
Total Protein: 7.4 g/dL (ref 6.1–8.1)

## 2016-02-09 LAB — HEMOGLOBIN A1C
Hgb A1c MFr Bld: 5.6 % (ref <5.7)
Mean Plasma Glucose: 114 mg/dL

## 2016-02-09 LAB — LIPID PANEL
CHOLESTEROL: 209 mg/dL — AB (ref 125–200)
HDL: 55 mg/dL (ref >=40)
LDL CALC: 136 mg/dL — AB (ref <130)
TRIGLYCERIDES: 88 mg/dL (ref <150)
Total CHOL/HDL Ratio: 3.8 Ratio (ref <=5.0)
VLDL: 18 mg/dL (ref <30)

## 2016-02-09 NOTE — Progress Notes (Signed)
Lab work only

## 2016-02-18 ENCOUNTER — Encounter: Payer: Self-pay | Admitting: *Deleted

## 2016-04-19 ENCOUNTER — Ambulatory Visit (INDEPENDENT_AMBULATORY_CARE_PROVIDER_SITE_OTHER): Payer: Managed Care, Other (non HMO) | Admitting: Urgent Care

## 2016-04-19 ENCOUNTER — Ambulatory Visit (INDEPENDENT_AMBULATORY_CARE_PROVIDER_SITE_OTHER): Payer: Managed Care, Other (non HMO)

## 2016-04-19 VITALS — BP 122/60 | HR 81 | Temp 98.2°F | Ht 72.0 in | Wt 202.0 lb

## 2016-04-19 DIAGNOSIS — R0789 Other chest pain: Secondary | ICD-10-CM

## 2016-04-19 MED ORDER — CYCLOBENZAPRINE HCL 5 MG PO TABS: 5.0000 mg | ORAL_TABLET | Freq: Three times a day (TID) | ORAL | 1 refills | Status: DC | PRN

## 2016-04-19 MED ORDER — FAMOTIDINE 20 MG PO TABS: 20.0000 mg | ORAL_TABLET | Freq: Two times a day (BID) | ORAL | 1 refills | Status: DC

## 2016-04-19 MED ORDER — NAPROXEN SODIUM 550 MG PO TABS: 550.0000 mg | ORAL_TABLET | Freq: Two times a day (BID) | ORAL | 1 refills | Status: DC

## 2016-04-19 NOTE — Progress Notes (Signed)
    MRN: [id number] DOB: 05-03-1965  Subjective:   Adam Cortez is a 50 y.o. male presenting for chief complaint of Chest Pain (X 1 mth but has gotten worse)  Reports 1 month history of worsening chest pain. Chest pain is located over lower chest, right to left, mildly sharp, sometimes radiates to neck or back. Has not tried any medications for relief. Denies diaphoresis, n/v, abdominal pain, radiation of chest pain to arms or jaw, fevers, weight loss. Chest pain occurs with certain movements, not associated with food. Patient does heavy manual labor, does asphalt. Denies smoking cigarettes. Denies family history of diabetes, HTN, HL, heart disease, cancer.   Adam Cortez has a current medication list which includes the following prescription(s): acetaminophen. Also has No Known Allergies.  Adam Cortez  has a past medical history of Allergy and Wears glasses. Also  has a past surgical history that includes No past surgeries and Lipoma excision (Right, 06/03/2013).  Objective:   Vitals: BP 122/60 (BP Location: Right Arm, Patient Position: Sitting, Cuff Size: Large)   Pulse 81   Temp 98.2 F (36.8 C) (Oral)   Ht 6' (1.829 m)   Wt 202 lb (91.6 kg)   SpO2 98%   BMI 27.40 kg/m   Physical Exam  Constitutional: He is oriented to person, place, and time. He appears well-developed and well-nourished.  HENT:  Mouth/Throat: Oropharynx is clear and moist.  Eyes: No scleral icterus.  Neck: Normal range of motion. Neck supple.  Cardiovascular: Normal rate, regular rhythm and intact distal pulses.  Exam reveals no gallop and no friction rub.   No murmur heard. Pulmonary/Chest: No respiratory distress. He has no wheezes. He has no rales. He exhibits no tenderness.  Abdominal: Soft. Bowel sounds are normal. He exhibits no distension and no mass. There is no tenderness. There is no guarding.  Lymphadenopathy:    He has no cervical adenopathy.  Neurological: He is alert and oriented to person, place, and time.   Skin: Skin is warm and dry.   ECG - sinus rhythm.  Dg Chest 2 View  Result Date: 04/19/2016 CLINICAL DATA:  One month history of worsening chest pain. EXAM: CHEST  2 VIEW COMPARISON:  None. FINDINGS: The heart size and mediastinal contours are within normal limits. Both lungs are clear. The visualized skeletal structures are unremarkable. IMPRESSION: No active cardiopulmonary disease. Electronically Signed   By: Fidela Salisbury M.D.   On: 04/19/2016 15:43   Assessment and Plan :   1. Atypical chest pain - ECG, chest x-ray, physical exam findings are reassuring. Will start conservative management including work restrictions. Address GERD with Pepcid. RTC in 2 weeks if no improvement.  Jaynee Eagles, PA-C [facility] [phone] 04/19/2016 2:50 PM

## 2016-04-19 NOTE — Patient Instructions (Addendum)
Costochondritis Costochondritis is swelling and irritation (inflammation) of the tissue (cartilage) that connects your ribs to your breastbone (sternum). This causes pain in the front of your chest. The pain usually starts gradually and involves more than one rib. What are the causes? The exact cause of this condition is not always known. It results from stress on the cartilage where your ribs attach to your sternum. The cause of this stress could be:  Chest injury (trauma).  Exercise or activity, such as lifting.  Severe coughing. What increases the risk? You may be at higher risk for this condition if you:  Are male.  Are 30?48 years old.  Recently started a new exercise or work activity.  Have low levels of vitamin D.  Have a condition that makes you cough frequently. What are the signs or symptoms? The main symptom of this condition is chest pain. The pain:  Usually starts gradually and can be sharp or dull.  Gets worse with deep breathing, coughing, or exercise.  Gets better with rest.  May be worse when you press on the sternum-rib connection (tenderness). How is this diagnosed? This condition is diagnosed based on your symptoms, medical history, and a physical exam. Your health care provider will check for tenderness when pressing on your sternum. This is the most important finding. You may also have tests to rule out other causes of chest pain. These may include:  A chest X-ray to check for lung problems.  An electrocardiogram (ECG) to see if you have a heart problem that could be causing the pain.  An imaging scan to rule out a chest or rib fracture. How is this treated? This condition usually goes away on its own over time. Your health care provider may prescribe an NSAID to reduce pain and inflammation. Your health care provider may also suggest that you:  Rest and avoid activities that make pain worse.  Apply heat or cold to the area to reduce pain and  inflammation.  Do exercises to stretch your chest muscles. If these treatments do not help, your health care provider may inject a numbing medicine at the sternum-rib connection to help relieve the pain. Follow these instructions at home:  Avoid activities that make pain worse. This includes any activities that use chest, abdominal, and side muscles.  If directed, put ice on the painful area:  Put ice in a plastic bag.  Place a towel between your skin and the bag.  Leave the ice on for 20 minutes, 2-3 times a day.  If directed, apply heat to the affected area as often as told by your health care provider. Use the heat source that your health care provider recommends, such as a moist heat pack or a heating pad.  Place a towel between your skin and the heat source.  Leave the heat on for 20-30 minutes.  Remove the heat if your skin turns bright red. This is especially important if you are unable to feel pain, heat, or cold. You may have a greater risk of getting burned.  Take over-the-counter and prescription medicines only as told by your health care provider.  Return to your normal activities as told by your health care provider. Ask your health care provider what activities are safe for you.  Keep all follow-up visits as told by your health care provider. This is important. Contact a health care provider if:  You have chills or a fever.  Your pain does not go away or it gets worse.    You have a cough that does not go away (is persistent). Get help right away if:  You have shortness of breath. This information is not intended to replace advice given to you by your health care provider. Make sure you discuss any questions you have with your health care provider. Document Released: 01/22/2005 Document Revised: 11/02/2015 Document Reviewed: 08/08/2015 Elsevier Interactive Patient Education  2017 [employer].     IF you received an x-ray today, you will receive an invoice  from [facility]. Please contact [facility] at [phone] with questions or concerns regarding your invoice.   IF you received labwork today, you will receive an invoice from [place]. Please contact [facility] at [phone] with questions or concerns regarding your invoice.   Our billing staff will not be able to assist you with questions regarding bills from these companies.  You will be contacted with the lab results as soon as they are available. The fastest way to get your results is to activate your My Chart account. Instructions are located on the last page of this paperwork. If you have not heard from [place] regarding the results in 2 weeks, please contact this office.

## 2016-04-20 LAB — CBC
HEMATOCRIT: 38.5 % (ref 37.5–51.0)
HEMOGLOBIN: 13.7 g/dL (ref 13.0–17.7)
MCH: 30.1 pg (ref 26.6–33.0)
MCHC: 35.6 g/dL (ref 31.5–35.7)
MCV: 85 fL (ref 79–97)
PLATELETS: 193 10*3/uL (ref 150–379)
RBC: 4.55 x10E6/uL (ref 4.14–5.80)
RDW: 12.7 % (ref 12.3–15.4)
WBC: 5.5 10*3/uL (ref 3.4–10.8)

## 2016-04-22 ENCOUNTER — Encounter: Payer: Self-pay | Admitting: Urgent Care

## 2016-12-30 ENCOUNTER — Ambulatory Visit: Payer: 59 | Admitting: Emergency Medicine

## 2017-01-06 ENCOUNTER — Encounter: Payer: Self-pay | Admitting: Emergency Medicine

## 2017-01-06 ENCOUNTER — Ambulatory Visit (INDEPENDENT_AMBULATORY_CARE_PROVIDER_SITE_OTHER): Payer: 59

## 2017-01-06 ENCOUNTER — Ambulatory Visit (INDEPENDENT_AMBULATORY_CARE_PROVIDER_SITE_OTHER): Payer: 59 | Admitting: Emergency Medicine

## 2017-01-06 VITALS — BP 120/77 | HR 67 | Temp 98.1°F | Resp 18 | Ht 72.0 in | Wt 195.6 lb

## 2017-01-06 DIAGNOSIS — R0789 Other chest pain: Secondary | ICD-10-CM | POA: Insufficient documentation

## 2017-01-06 DIAGNOSIS — Z23 Encounter for immunization: Secondary | ICD-10-CM | POA: Diagnosis not present

## 2017-01-06 MED ORDER — DICLOFENAC SODIUM 75 MG PO TBEC: 75.0000 mg | DELAYED_RELEASE_TABLET | Freq: Two times a day (BID) | ORAL | 0 refills | Status: AC

## 2017-01-06 NOTE — Patient Instructions (Addendum)
   IF you received an x-ray today, you will receive an invoice from [facility]. Please contact [facility] at [phone] with questions or concerns regarding your invoice.   IF you received labwork today, you will receive an invoice from [facility]. Please contact [facility] at [phone] with questions or concerns regarding your invoice.   Our billing staff will not be able to assist you with questions regarding bills from these companies.  You will be contacted with the lab results as soon as they are available. The fastest way to get your results is to activate your My Chart account. Instructions are located on the last page of this paperwork. If you have not heard from us regarding the results in 2 weeks, please contact this office.     Nonspecific Chest Pain Chest pain can be caused by many different conditions. There is a chance that your pain could be related to something serious, such as a heart attack or a blood clot in your lungs. Chest pain can also be caused by conditions that are not life-threatening. If you have chest pain, it is very important to follow up with your doctor. Follow these instructions at home: Medicines  If you were prescribed an antibiotic medicine, take it as told by your doctor. Do not stop taking the antibiotic even if you start to feel better.  Take over-the-counter and prescription medicines only as told by your doctor. Lifestyle  Do not use any products that contain nicotine or tobacco, such as cigarettes and e-cigarettes. If you need help quitting, ask your doctor.  Do not drink alcohol.  Make lifestyle changes as told by your doctor. These may include: ? Getting regular exercise. Ask your doctor for some activities that are safe for you. ? Eating a heart-healthy diet. A diet specialist (dietitian) can help you to learn healthy eating options. ? Staying at a healthy weight. ? Managing diabetes, if needed. ? Lowering your  stress, as with deep breathing or spending time in nature. General instructions  Avoid any activities that make you feel chest pain.  If your chest pain is because of heartburn: ? Raise (elevate) the head of your bed about 6 inches (15 cm). You can do this by putting blocks under the bed legs at the head of the bed. ? Do not sleep with extra pillows under your head. That does not help heartburn.  Keep all follow-up visits as told by your doctor. This is important. This includes any further testing if your chest pain does not go away. Contact a doctor if:  Your chest pain does not go away.  You have a rash with blisters on your chest.  You have a fever.  You have chills. Get help right away if:  Your chest pain is worse.  You have a cough that gets worse, or you cough up blood.  You have very bad (severe) pain in your belly (abdomen).  You are very weak.  You pass out (faint).  You have either of these for no clear reason: ? Sudden chest discomfort. ? Sudden discomfort in your arms, back, neck, or jaw.  You have shortness of breath at any time.  You suddenly start to sweat, or your skin gets clammy.  You feel sick to your stomach (nauseous).  You throw up (vomit).  You suddenly feel light-headed or dizzy.  Your heart starts to beat fast, or it feels like it is skipping beats. These symptoms may be an emergency. Do not wait   to see if the symptoms will go away. Get medical help right away. Call your local emergency services (911 in the [place] Do not drive yourself to the hospital. This information is not intended to replace advice given to you by your health care provider. Make sure you discuss any questions you have with your health care provider. Document Released: 10/01/2007 Document Revised: 01/07/2016 Document Reviewed: 01/07/2016 Elsevier Interactive Patient Education  2017 Elsevier Inc.  

## 2017-01-06 NOTE — Progress Notes (Signed)
Adam Cortez 51 y.o.   Chief Complaint  Patient presents with  . Chest Pain    has hx of chest pain; states it is worst this time  . wellness form    needs wellness form filled out for work  . Flu Vaccine    HISTORY OF PRESENT ILLNESS: This is a 51 y.o. male complaining of right sided lower chest/RUQ intermittent pains x 2 weeks; short lived episodes not associated with any other significant symptoms.  Chest Pain   This is a recurrent problem. The current episode started 1 to 4 weeks ago. The onset quality is undetermined. The problem occurs intermittently. The problem has been waxing and waning. Pain location: right sided. The pain is at a severity of 3/10. The pain is mild. The quality of the pain is described as sharp. The pain does not radiate. Associated symptoms include abdominal pain (RUQ). Pertinent negatives include no back pain, claudication, cough, diaphoresis, dizziness, exertional chest pressure, fever, headaches, hemoptysis, irregular heartbeat, lower extremity edema, nausea, near-syncope, numbness, orthopnea, palpitations, shortness of breath, sputum production, syncope, vomiting or weakness.     Prior to Admission medications   Medication Sig Start Date End Date Taking? Authorizing Provider  acetaminophen (TYLENOL) 500 MG tablet Take 1,000 mg by mouth once.   Yes [provider]  cyclobenzaprine (FLEXERIL) 5 MG tablet Take 1 tablet (5 mg total) by mouth 3 (three) times daily as needed. 04/19/16  Yes Jaynee Eagles, PA-C    No Known Allergies  Patient Active Problem List   Diagnosis Date Noted  . Hyperlipidemia 01/25/2015  . Benign essential HTN 01/25/2015  . Erectile dysfunction 12/28/2014  . Anejaculation 12/28/2014  . 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 Allergy   . Wears glasses     Past Surgical History:  Procedure Laterality Date  . LIPOMA EXCISION Right 06/03/2013   Procedure: EXCISION LIPOMA BACK;   Surgeon: Imogene Burn. Georgette Dover, MD;  Location: [place];  Service: General;  Laterality: Right;  . NO PAST SURGERIES      Social History   Social History  . Marital status: Married    Spouse name: [name]  . Number of children: N/A  . Years of education: N/A   Occupational History  . Not on file.   Social History Main Topics  . Smoking status: Never Smoker  . Smokeless tobacco: Never Used  . Alcohol use No  . Drug use: No  . Sexual activity: Yes   Other Topics Concern  . Not on file   Social History Narrative   From [place].    No family history on file.   Review of Systems  Constitutional: Negative for diaphoresis, fever and weight loss.  HENT: Negative.  Negative for congestion, nosebleeds and sore throat.   Eyes: Negative.  Negative for blurred vision and double vision.  Respiratory: Negative for cough, hemoptysis, sputum production, shortness of breath and wheezing.   Cardiovascular: Positive for chest pain. Negative for palpitations, orthopnea, claudication, syncope and near-syncope.  Gastrointestinal: Positive for abdominal pain (RUQ). Negative for blood in stool, diarrhea, melena, nausea and vomiting.  Genitourinary: Negative.  Negative for dysuria, flank pain and hematuria.  Musculoskeletal: Negative for back pain, joint pain, myalgias and neck pain.  Neurological: Negative.  Negative for dizziness, sensory change, focal weakness, weakness, numbness and headaches.  Endo/Heme/Allergies: Negative.   All other systems reviewed and are negative.  Vitals:   01/06/17 1526  BP: 120/77  Pulse: 67  Resp: 18  Temp: 98.1 F (36.7 C)  SpO2: 98%   EKG: NSR, no acute ischemic changes. VR 62  Physical Exam  Constitutional: He is oriented to person, place, and time. He appears well-developed and well-nourished.  HENT:  Head: Normocephalic and atraumatic.  Right Ear: External ear normal.  Left Ear: External ear normal.  Nose: Nose normal.    Mouth/Throat: Oropharynx is clear and moist.  Eyes: Pupils are equal, round, and reactive to light. Conjunctivae and EOM are normal.  Neck: Normal range of motion. Neck supple. No JVD present.  Cardiovascular: Normal rate, regular rhythm, normal heart sounds and intact distal pulses.   Pulmonary/Chest: Effort normal and breath sounds normal.  Abdominal: Soft. Bowel sounds are normal. He exhibits no distension. There is no tenderness. There is no guarding.  Musculoskeletal: Normal range of motion.  Lymphadenopathy:    He has no cervical adenopathy.  Neurological: He is alert and oriented to person, place, and time. No sensory deficit. He exhibits normal muscle tone.  Skin: Skin is warm and dry. Capillary refill takes less than 2 seconds. No rash noted.  Psychiatric: He has a normal mood and affect. His behavior is normal.  Vitals reviewed.    ASSESSMENT & PLAN: Kaydenn was seen today for chest pain, wellness form and flu vaccine.  Diagnoses and all orders for this visit:  Atypical chest pain -     Cancel: CBC with Differential/Platelet -     Cancel: Comprehensive metabolic panel -     Cancel: Lipid panel -     Cancel: Hemoglobin A1c -     DG Chest 2 View; Future  Chest discomfort -     EKG 12-Lead -     CBC with Differential/Platelet -     Comprehensive metabolic panel -     Lipid panel -     Hemoglobin A1c  Need for influenza vaccination -     Flu Vaccine QUAD 6+ mos PF IM (Fluarix Quad PF)  Other orders -     diclofenac (VOLTAREN) 75 MG EC tablet; Take 1 tablet (75 mg total) by mouth 2 (two) times daily.    Patient Instructions       IF you received an x-ray today, you will receive an invoice from [facility]. Please contact [facility] at [phone] with questions or concerns regarding your invoice.   IF you received labwork today, you will receive an invoice from [place]. Please contact [facility] at [phone] with questions or concerns  regarding your invoice.   Our billing staff will not be able to assist you with questions regarding bills from these companies.  You will be contacted with the lab results as soon as they are available. The fastest way to get your results is to activate your My Chart account. Instructions are located on the last page of this paperwork. If you have not heard from [place] regarding the results in 2 weeks, please contact this office.      Nonspecific Chest Pain Chest pain can be caused by many different conditions. There is a chance that your pain could be related to something serious, such as a heart attack or a blood clot in your lungs. Chest pain can also be caused by conditions that are not life-threatening. If you have chest pain, it is very important to follow up with your doctor. Follow these instructions at home: Medicines  If you were prescribed an antibiotic medicine, take it as told  by your doctor. Do not stop taking the antibiotic even if you start to feel better.  Take over-the-counter and prescription medicines only as told by your doctor. Lifestyle  Do not use any products that contain nicotine or tobacco, such as cigarettes and e-cigarettes. If you need help quitting, ask your doctor.  Do not drink alcohol.  Make lifestyle changes as told by your doctor. These may include: ? Getting regular exercise. Ask your doctor for some activities that are safe for you. ? Eating a heart-healthy diet. A diet specialist (dietitian) can help you to learn healthy eating options. ? Staying at a healthy weight. ? Managing diabetes, if needed. ? Lowering your stress, as with deep breathing or spending time in nature. General instructions  Avoid any activities that make you feel chest pain.  If your chest pain is because of heartburn: ? Raise (elevate) the head of your bed about 6 inches (15 cm). You can do this by putting blocks under the bed legs at the head of the bed. ? Do not sleep with  extra pillows under your head. That does not help heartburn.  Keep all follow-up visits as told by your doctor. This is important. This includes any further testing if your chest pain does not go away. Contact a doctor if:  Your chest pain does not go away.  You have a rash with blisters on your chest.  You have a fever.  You have chills. Get help right away if:  Your chest pain is worse.  You have a cough that gets worse, or you cough up blood.  You have very bad (severe) pain in your belly (abdomen).  You are very weak.  You pass out (faint).  You have either of these for no clear reason: ? Sudden chest discomfort. ? Sudden discomfort in your arms, back, neck, or jaw.  You have shortness of breath at any time.  You suddenly start to sweat, or your skin gets clammy.  You feel sick to your stomach (nauseous).  You throw up (vomit).  You suddenly feel light-headed or dizzy.  Your heart starts to beat fast, or it feels like it is skipping beats. These symptoms may be an emergency. Do not wait to see if the symptoms will go away. Get medical help right away. Call your local emergency services (911 in the [place] Do not drive yourself to the hospital. This information is not intended to replace advice given to you by your health care provider. Make sure you discuss any questions you have with your health care provider. Document Released: 10/01/2007 Document Revised: 01/07/2016 Document Reviewed: 01/07/2016 Elsevier Interactive Patient Education  2017 Elsevier Inc.      Agustina Caroli, MD [facility]

## 2017-01-07 ENCOUNTER — Encounter: Payer: Self-pay | Admitting: Radiology

## 2017-01-07 LAB — COMPREHENSIVE METABOLIC PANEL
AG RATIO: 1.7 (calc) (ref 1.0–2.5)
ALT: 19 U/L (ref 9–46)
AST: 24 U/L (ref 10–40)
Albumin: 4.5 g/dL (ref 3.6–5.1)
Alkaline phosphatase (APISO): 35 U/L — ABNORMAL LOW (ref 40–115)
BILIRUBIN TOTAL: 0.4 mg/dL (ref 0.2–1.2)
BUN/Creatinine Ratio: 13 (calc) (ref 6–22)
BUN: 20 mg/dL (ref 7–25)
CO2: 26 mmol/L (ref 20–32)
Calcium: 9.3 mg/dL (ref 8.6–10.3)
Chloride: 104 mmol/L (ref 98–110)
Creat: 1.54 mg/dL — ABNORMAL HIGH (ref 0.60–1.35)
Globulin: 2.6 g/dL (calc) (ref 1.9–3.7)
Glucose, Bld: 87 mg/dL (ref 65–99)
POTASSIUM: 4.2 mmol/L (ref 3.5–5.3)
Sodium: 138 mmol/L (ref 135–146)
Total Protein: 7.1 g/dL (ref 6.1–8.1)

## 2017-01-07 LAB — CBC WITH DIFFERENTIAL/PLATELET
BASOS PCT: 0.6 %
Basophils Absolute: 32 cells/uL (ref 0–200)
EOS ABS: 69 {cells}/uL (ref 15–500)
EOS PCT: 1.3 %
HEMATOCRIT: 39.6 % (ref 38.5–50.0)
HEMOGLOBIN: 13.7 g/dL (ref 13.2–17.1)
LYMPHS ABS: 2645 {cells}/uL (ref 850–3900)
MCH: 29.5 pg (ref 27.0–33.0)
MCHC: 34.6 g/dL (ref 32.0–36.0)
MCV: 85.3 fL (ref 80.0–100.0)
MPV: 10.1 fL (ref 7.5–12.5)
Monocytes Relative: 6.8 %
Neutro Abs: 2194 cells/uL (ref 1500–7800)
Neutrophils Relative %: 41.4 %
Platelets: 205 10*3/uL (ref 140–400)
RBC: 4.64 10*6/uL (ref 4.20–5.80)
RDW: 13.1 % (ref 11.0–15.0)
Total Lymphocyte: 49.9 %
WBC mixed population: 360 cells/uL (ref 200–950)
WBC: 5.3 10*3/uL (ref 3.8–10.8)

## 2017-01-07 LAB — LIPID PANEL
CHOLESTEROL: 198 mg/dL (ref <200)
HDL: 65 mg/dL (ref >40)
LDL CHOLESTEROL (CALC): 116 mg/dL — AB
Non-HDL Cholesterol (Calc): 133 mg/dL (calc) — ABNORMAL HIGH (ref <130)
Total CHOL/HDL Ratio: 3 (calc) (ref <5.0)
Triglycerides: 74 mg/dL (ref <150)

## 2017-01-07 LAB — HEMOGLOBIN A1C
EAG (MMOL/L): 6.5 (calc)
Hgb A1c MFr Bld: 5.7 % of total Hgb — ABNORMAL HIGH (ref <5.7)
Mean Plasma Glucose: 117 (calc)

## 2017-01-16 ENCOUNTER — Telehealth: Payer: Self-pay | Admitting: Emergency Medicine

## 2017-01-16 NOTE — Telephone Encounter (Signed)
Patient called to check to see if the paperwork for their health assessment for his insurance had been completed. The patient would like to know if it has been completed and would like to be contacted when the information is sent over. The patient also would like to inquire about the blood work results they have received.   Best Number: [phone]   Please Advise

## 2017-01-17 ENCOUNTER — Telehealth: Payer: Self-pay | Admitting: Emergency Medicine

## 2017-01-17 NOTE — Telephone Encounter (Incomplete)
Pts wife called in because a form needed to be filled out by today &

## 2017-01-20 NOTE — Telephone Encounter (Signed)
Spoke with patient's wife and advised that form was ready to be picked up. Wife asked if she could pick up forms for him and I advised her that was okay.

## 2017-07-24 ENCOUNTER — Ambulatory Visit: Payer: 59 | Admitting: Emergency Medicine

## 2017-07-25 ENCOUNTER — Encounter: Payer: Self-pay | Admitting: Physician Assistant

## 2017-07-25 ENCOUNTER — Ambulatory Visit (INDEPENDENT_AMBULATORY_CARE_PROVIDER_SITE_OTHER): Payer: 59 | Admitting: Physician Assistant

## 2017-07-25 VITALS — BP 106/72 | HR 60 | Temp 98.4°F | Ht 72.0 in | Wt 189.0 lb

## 2017-07-25 DIAGNOSIS — L989 Disorder of the skin and subcutaneous tissue, unspecified: Secondary | ICD-10-CM

## 2017-07-25 DIAGNOSIS — E785 Hyperlipidemia, unspecified: Secondary | ICD-10-CM | POA: Diagnosis not present

## 2017-07-25 DIAGNOSIS — I10 Essential (primary) hypertension: Secondary | ICD-10-CM | POA: Diagnosis not present

## 2017-07-25 DIAGNOSIS — R739 Hyperglycemia, unspecified: Secondary | ICD-10-CM | POA: Diagnosis not present

## 2017-07-25 DIAGNOSIS — Z23 Encounter for immunization: Secondary | ICD-10-CM

## 2017-07-25 NOTE — Patient Instructions (Addendum)
Schedule the Annual Exam and removal of the irritated mole at your convenience.    IF you received an x-ray today, you will receive an invoice from [facility]. Please contact [facility] at [phone] with questions or concerns regarding your invoice.   IF you received labwork today, you will receive an invoice from [place]. Please contact [facility] at [phone] with questions or concerns regarding your invoice.   Our billing staff will not be able to assist you with questions regarding bills from these companies.  You will be contacted with the lab results as soon as they are available. The fastest way to get your results is to activate your My Chart account. Instructions are located on the last page of this paperwork. If you have not heard from [place] regarding the results in 2 weeks, please contact this office.

## 2017-07-25 NOTE — Assessment & Plan Note (Signed)
Well controlled 

## 2017-07-25 NOTE — Progress Notes (Signed)
Patient ID: Adam Cortez, male    DOB: 1965/07/04, 52 y.o.   MRN: [id number]  PCP: Adam Agreste, MD  Chief Complaint  Patient presents with  . Follow-up    saw eye dr.  they recommended check on cholesterol  . Annual Exam    Subjective:   Presents for evaluation of hyperlipidemia. Accompanied by his wife, Adam Cortez.  Prefers to wait for Annual Exam with his PCP.  Tender lump x 1 day, thinks that it may have been present longer, but not bothersome until now.   No CP, SOB, HA, dizziness, nausea, vomiting or diarrhea. 3rd shift, so is very tired now.      Review of Systems  Constitutional: Negative for chills and fever.  Respiratory: Negative for cough and shortness of breath.   Cardiovascular: Negative for chest pain, palpitations and leg swelling.  Gastrointestinal: Negative for diarrhea, nausea and vomiting.  Endocrine: Negative for polydipsia.  Genitourinary: Negative for dysuria, frequency and urgency.  Musculoskeletal: Negative for myalgias.  Skin: Negative for rash.  Neurological: Negative for dizziness and headaches.       Patient Active Problem List   Diagnosis Date Noted  . Chest discomfort 01/06/2017  . Atypical chest pain 01/06/2017  . Hyperlipidemia 01/25/2015  . Benign essential HTN 01/25/2015  . Erectile dysfunction 12/28/2014  . Anejaculation 12/28/2014  . Lipoma of back - 11 cm 05/09/2013     Prior to Admission medications   Not on File     No Known Allergies     Objective:  Physical Exam  Constitutional: He is oriented to person, place, and time. He appears well-developed and well-nourished. He is active and cooperative. No distress.  BP 106/72 (BP Location: Left Arm, Patient Position: Sitting, Cuff Size: Large)   Pulse 60   Temp 98.4 F (36.9 C) (Oral)   Ht 6' (1.829 m)   Wt 189 lb (85.7 kg)   PF 100 L/min   BMI 25.63 kg/m   HENT:  Head: Normocephalic and atraumatic.  Right Ear: Hearing normal.  Left Ear: Hearing  normal.  Eyes: Conjunctivae are normal. No scleral icterus.  Neck: Normal range of motion. Neck supple. No thyromegaly present.  Cardiovascular: Normal rate, regular rhythm and normal heart sounds.  Pulses:      Radial pulses are 2+ on the right side, and 2+ on the left side.  Pulmonary/Chest: Effort normal and breath sounds normal.  Lymphadenopathy:       Head (right side): No tonsillar, no preauricular, no posterior auricular and no occipital adenopathy present.       Head (left side): No tonsillar, no preauricular, no posterior auricular and no occipital adenopathy present.    He has no cervical adenopathy.       Right: No supraclavicular adenopathy present.       Left: No supraclavicular adenopathy present.  Neurological: He is alert and oriented to person, place, and time. No sensory deficit.  Skin: Skin is warm, dry and intact. Lesion noted. No rash noted. No cyanosis or erythema. Nails show no clubbing.     Psychiatric: He has a normal mood and affect. His speech is normal and behavior is normal.           Assessment & Plan:   Problem List Items Addressed This Visit    Hyperlipidemia - Primary    Not currently on treatment. Await labs. Start streatment as indicated by results.      Relevant Orders   Comprehensive metabolic panel  Lipid panel   Benign essential HTN    Well controlled.      Relevant Orders   CBC with Differential/Platelet   Comprehensive metabolic panel    Other Visit Diagnoses    Need for Tdap vaccination       Relevant Orders   Tdap vaccine greater than or equal to 7yo IM (Completed)   Hyperglycemia       Relevant Orders   Comprehensive metabolic panel   Hemoglobin A1c   Skin lesion of back           Return for Annual Exam with Dr. Carlota Raspberry, and lesion excision with PA.   Adam Chute, PA-C [facility] [place]

## 2017-07-25 NOTE — Assessment & Plan Note (Signed)
Not currently on treatment. Await labs. Start streatment as indicated by results.

## 2017-07-26 LAB — CBC WITH DIFFERENTIAL/PLATELET
BASOS: 1 %
Basophils Absolute: 0 10*3/uL (ref 0.0–0.2)
EOS (ABSOLUTE): 0.1 10*3/uL (ref 0.0–0.4)
Eos: 2 %
HEMATOCRIT: 37 % — AB (ref 37.5–51.0)
Hemoglobin: 13 g/dL (ref 13.0–17.7)
IMMATURE GRANS (ABS): 0 10*3/uL (ref 0.0–0.1)
IMMATURE GRANULOCYTES: 0 %
LYMPHS: 45 %
Lymphocytes Absolute: 2.3 10*3/uL (ref 0.7–3.1)
MCH: 29.5 pg (ref 26.6–33.0)
MCHC: 35.1 g/dL (ref 31.5–35.7)
MCV: 84 fL (ref 79–97)
Monocytes Absolute: 0.3 10*3/uL (ref 0.1–0.9)
Monocytes: 6 %
NEUTROS PCT: 46 %
Neutrophils Absolute: 2.4 10*3/uL (ref 1.4–7.0)
PLATELETS: 183 10*3/uL (ref 150–379)
RBC: 4.4 x10E6/uL (ref 4.14–5.80)
RDW: 13.5 % (ref 12.3–15.4)
WBC: 5.2 10*3/uL (ref 3.4–10.8)

## 2017-07-26 LAB — COMPREHENSIVE METABOLIC PANEL
ALT: 14 IU/L (ref 0–44)
AST: 19 IU/L (ref 0–40)
Albumin/Globulin Ratio: 1.5 (ref 1.2–2.2)
Albumin: 4 g/dL (ref 3.5–5.5)
Alkaline Phosphatase: 44 IU/L (ref 39–117)
BUN/Creatinine Ratio: 13 (ref 9–20)
BUN: 18 mg/dL (ref 6–24)
Bilirubin Total: <0.2 mg/dL (ref 0.0–1.2)
CO2: 22 mmol/L (ref 20–29)
Calcium: 8.9 mg/dL (ref 8.7–10.2)
Chloride: 104 mmol/L (ref 96–106)
Creatinine, Ser: 1.35 mg/dL — ABNORMAL HIGH (ref 0.76–1.27)
GFR calc Af Amer: 71 mL/min/{1.73_m2} (ref >59)
GFR calc non Af Amer: 61 mL/min/{1.73_m2} (ref >59)
GLOBULIN, TOTAL: 2.7 g/dL (ref 1.5–4.5)
Glucose: 108 mg/dL — ABNORMAL HIGH (ref 65–99)
Potassium: 4.1 mmol/L (ref 3.5–5.2)
Sodium: 140 mmol/L (ref 134–144)
TOTAL PROTEIN: 6.7 g/dL (ref 6.0–8.5)

## 2017-07-26 LAB — LIPID PANEL
Chol/HDL Ratio: 3.1 ratio (ref 0.0–5.0)
Cholesterol, Total: 169 mg/dL (ref 100–199)
HDL: 55 mg/dL (ref >39)
LDL Calculated: 105 mg/dL — ABNORMAL HIGH (ref 0–99)
Triglycerides: 45 mg/dL (ref 0–149)
VLDL Cholesterol Cal: 9 mg/dL (ref 5–40)

## 2017-07-26 LAB — HEMOGLOBIN A1C
Est. average glucose Bld gHb Est-mCnc: 128 mg/dL
Hgb A1c MFr Bld: 6.1 % — ABNORMAL HIGH (ref 4.8–5.6)

## 2017-08-14 ENCOUNTER — Telehealth: Payer: Self-pay

## 2017-08-14 NOTE — Telephone Encounter (Signed)
Please advise on labs from 3/30  Copied from [place] [id number]. Topic: Quick Communication - Lab Results >> Aug 13, 2017  1:18 PM Synthia Innocent wrote: Requesting lab results

## 2017-08-17 NOTE — Telephone Encounter (Signed)
Result note routed to lab pool.

## 2017-08-21 ENCOUNTER — Ambulatory Visit (INDEPENDENT_AMBULATORY_CARE_PROVIDER_SITE_OTHER): Payer: 59 | Admitting: Family Medicine

## 2017-08-21 ENCOUNTER — Ambulatory Visit (INDEPENDENT_AMBULATORY_CARE_PROVIDER_SITE_OTHER): Payer: 59

## 2017-08-21 ENCOUNTER — Other Ambulatory Visit: Payer: Self-pay

## 2017-08-21 ENCOUNTER — Encounter: Payer: Self-pay | Admitting: Family Medicine

## 2017-08-21 VITALS — BP 110/70 | HR 57 | Temp 97.9°F | Ht 74.0 in | Wt 191.8 lb

## 2017-08-21 DIAGNOSIS — R079 Chest pain, unspecified: Secondary | ICD-10-CM

## 2017-08-21 DIAGNOSIS — R109 Unspecified abdominal pain: Secondary | ICD-10-CM | POA: Diagnosis not present

## 2017-08-21 DIAGNOSIS — G4700 Insomnia, unspecified: Secondary | ICD-10-CM | POA: Diagnosis not present

## 2017-08-21 DIAGNOSIS — R0789 Other chest pain: Secondary | ICD-10-CM

## 2017-08-21 DIAGNOSIS — M791 Myalgia, unspecified site: Secondary | ICD-10-CM | POA: Diagnosis not present

## 2017-08-21 DIAGNOSIS — G4726 Circadian rhythm sleep disorder, shift work type: Secondary | ICD-10-CM | POA: Diagnosis not present

## 2017-08-21 MED ORDER — HYDROXYZINE HCL 25 MG PO TABS: 12.5000 mg | ORAL_TABLET | Freq: Every evening | ORAL | 0 refills | Status: DC | PRN

## 2017-08-21 MED ORDER — MELOXICAM 7.5 MG PO TABS: 7.5000 mg | ORAL_TABLET | Freq: Every day | ORAL | 0 refills | Status: DC

## 2017-08-21 NOTE — Patient Instructions (Addendum)
Chest wall pain may be due to type of work.  Some of your symptoms may also be due to disordered sleep with your work schedule.  I will check some blood work as we discussed for other causes of your pain, but for now can try meloxicam 1/day.  Stop if that causes any worsening stomach symptoms.  Hydroxyzine if needed at bedtime.  Recheck in 2 weeks. Return to the clinic or go to the nearest emergency room if any of your symptoms worsen or new symptoms occur.    IF you received an x-ray today, you will receive an invoice from [facility]. Please contact [facility] at [phone] with questions or concerns regarding your invoice.   IF you received labwork today, you will receive an invoice from [facility]. Please contact [facility] at [phone] with questions or concerns regarding your invoice.   Our billing staff will not be able to assist you with questions regarding bills from these companies.  You will be contacted with the lab results as soon as they are available. The fastest way to get your results is to activate your My Chart account. Instructions are located on the last page of this paperwork. If you have not heard from [place] regarding the results in 2 weeks, please contact this office.

## 2017-08-21 NOTE — Progress Notes (Addendum)
Subjective:  By signing my name below, I, Adam Cortez, attest that this documentation has been prepared under the direction and in the presence of [name], MD. Electronically Signed: [name], [place]. 08/21/2017 , 3:18 PM .  Patient was seen in Room 2 .   Patient ID: Adam Cortez, male    DOB: 11-26-1965, 52 y.o.   MRN: [id number] Chief Complaint  Patient presents with  . Annual Exam    CPE   HPI  Adam Cortez is a 52 y.o. male  Patient was initially scheduled for an annual physical, but has multiple ROS complaints. Will reschedule for a physical. He works at [employer], with physical work.   Patient was seen on March 30th by [name], PA-C for a tender lump over his back. He also had DM screening and noted pre-diabetes 1 month ago.   Abdominal pain Patient states he's been having intermittent abdominal pain for about a month now. He denies constipation, diarrhea, nausea, vomiting or Cortez in stool. He locates pain all over his abdomen, and radiates around bilateral rib pains.   Bilateral rib pain He also complains having persistent and constant bilateral rib pain. He denies trouble breathing with the pain. He denies any specific activity for the pain. When he bends forward, he does have some back pain.   Chest wall pain He also notes having intermittent chest wall pain with fatigue. He denies fever or unexplained weight loss. He denies any specific activity that improves or worsens his symptoms. He denies heartburn or reflux. He denies shortness of breath. He denies any new rash or skin changes. He's tried VapoRub and his wife's gabapentin (400mg ) without any relief of symptoms.   Patient was seen by Dr. [name] in Sept 2018 with 2 weeks chest pain. EKG was sinus rhythm. He was started on Voltaren BID without relief. He denies history of Cortez clot. His last foreign air-travel return from [place] in Feb 24th, denies calf swelling or calf pain at that time.    Difficulty staying asleep + fatigue He informs working night shifts from 5:00PM to 6:00AM, and alternates to day shift every 3-4 weeks, causing difficulty staying asleep. He hasn't tried OTC sleep aids or medications. He denies history of depression and stress.   His wife states patient seems weak and sluggish at home.   Patient Active Problem List   Diagnosis Date Noted  . Chest discomfort 01/06/2017  . Atypical chest pain 01/06/2017  . Hyperlipidemia 01/25/2015  . Benign essential HTN 01/25/2015  . Erectile dysfunction 12/28/2014  . Anejaculation 12/28/2014  . 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 Allergy   . Wears glasses    Past Surgical History:  Procedure Laterality Date  . LIPOMA EXCISION Right 06/03/2013   Procedure: EXCISION LIPOMA BACK;  Surgeon: Imogene Burn. Georgette Dover, MD;  Location: [facility];  Service: General;  Laterality: Right;  . NO PAST SURGERIES     No Known Allergies Prior to Admission medications   Not on File   Social History   Socioeconomic History  . Marital status: Married    Spouse name: [name]  . Number of children: 2  . Years of education: Not on file  . Highest education level: Not on file  Occupational History    Employer: [employer]  Social Needs  . Financial resource strain: Not on file  . Food insecurity:    Worry: Not on file  Inability: Not on file  . Transportation needs:    Medical: Not on file    Non-medical: Not on file  Tobacco Use  . Smoking status: Never Smoker  . Smokeless tobacco: Never Used  Substance and Sexual Activity  . Alcohol use: No  . Drug use: No  . Sexual activity: Yes  Lifestyle  . Physical activity:    Days per week: Not on file    Minutes per session: Not on file  . Stress: Not on file  Relationships  . Social connections:    Talks on phone: Not on file    Gets together: Not on file    Attends religious service: Not on file    Active  member of club or organization: Not on file    Attends meetings of clubs or organizations: Not on file    Relationship status: Not on file  . Intimate partner violence:    Fear of current or ex partner: Not on file    Emotionally abused: Not on file    Physically abused: Not on file    Forced sexual activity: Not on file  Other Topics Concern  . Not on file  Social History Narrative   From [place].   Came to the [place] in 1996.   Lives with his wife and their twins (fraternal), born in 2009.    Review of Systems  Constitutional: Positive for fatigue. Negative for chills and fever.  Respiratory: Negative for shortness of breath.   Cardiovascular: Positive for chest pain (and rib pain). Negative for leg swelling.  Gastrointestinal: Positive for abdominal pain. Negative for Cortez in stool, constipation, diarrhea, nausea and vomiting.  Musculoskeletal: Positive for back pain and myalgias.  Skin: Negative for rash.  Allergic/Immunologic: Positive for environmental allergies.  Neurological: Positive for weakness (sluggish).  Psychiatric/Behavioral: Positive for agitation and sleep disturbance.       Objective:   Physical Exam  Constitutional: He is oriented to person, place, and time. He appears well-developed and well-nourished. No distress.  HENT:  Head: Normocephalic and atraumatic.  Eyes: Pupils are equal, round, and reactive to light. EOM are normal.  Neck: Neck supple.  Cardiovascular: Normal rate.  Pulmonary/Chest: Effort normal. No respiratory distress.  Locates soreness along lateral rib margins; with bilateral arm elevation causes discomfort along chest wall  Abdominal: There is tenderness (minimal) in the right lower quadrant. There is no rebound, no guarding, no CVA tenderness, no tenderness at McBurney's point and negative Murphy's sign.  Musculoskeletal: Normal range of motion.  Upper and lower extremity strength intact  Neurological: He is alert and oriented to person,  place, and time. He displays a negative Romberg sign.  Negative romberg, no pronator drift   Skin: Skin is warm and dry.  Psychiatric: He has a normal mood and affect. His behavior is normal.  Nursing note and vitals reviewed.   Vitals:   08/21/17 1350  BP: 110/70  Pulse: (!) 57  Temp: 97.9 F (36.6 C)  TempSrc: Oral  SpO2: 97%  Weight: 191 lb 12.8 oz (87 kg)  Height: 6\' 2"  (1.88 m)   EKG: sinus bradycardia rate 53, PR 166, no acute findings  Dg Chest 2 View  Result Date: 08/21/2017 CLINICAL DATA:  Intermittent left-sided chest wall pain. EXAM: CHEST - 2 VIEW COMPARISON:  01/06/2017 FINDINGS: The heart size and mediastinal contours are within normal limits. Both lungs are clear. No pleural effusion or pneumothorax. The visualized skeletal structures are unremarkable. IMPRESSION: Normal chest radiographs. Electronically Signed  By: Lajean Manes M.D.   On: 08/21/2017 15:35       Assessment & Plan:   Chistopher Mangino is a 52 y.o. male Myalgia - Plan: CK, Sedimentation Rate, D-dimer, quantitative (not at [facility]) Chest wall pain - Plan: CBC, D-dimer, quantitative (not at [facility]), meloxicam (MOBIC) 7.5 MG tablet Chest pain, unspecified type - Plan: CBC, DG Chest 2 View, EKG 12-Lead, Sedimentation Rate, D-dimer, quantitative (not at [facility]) Abdominal pain, unspecified abdominal location - Plan: CBC  -Various arthralgias, specific chest wall/lateral chest wall, and abdominal pain.  Symptoms have been present for some time as above, reassuring exam and vital signs, will check CBC, d-dimer as did report overseas travel, chest x-ray obtained without concerns, EKG reassuring.  Will also check sed rate to see if inflammatory process, but for now discussed continuing symptomatic care, including with meloxicam 7.5 mg daily as needed.  Stop if worsening symptoms.  Recheck in 2 weeks sooner if worse. 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  Hydroxyzine prescribed if needed with potential side effects discussed   Meds ordered this encounter  Medications  . hydrOXYzine (ATARAX/VISTARIL) 25 MG tablet    Sig: Take 0.5-1 tablets (12.5-25 mg total) by mouth at bedtime as needed.    Dispense:  30 tablet    Refill:  0  . meloxicam (MOBIC) 7.5 MG tablet    Sig: Take 1 tablet (7.5 mg total) by mouth daily.    Dispense:  30 tablet    Refill:  0   Patient Instructions   Chest wall pain may be due to type of work.  Some of your symptoms may also be due to disordered sleep with your work schedule.  I will check some Cortez work as we discussed for other causes of your pain, but for now can try meloxicam 1/day.  Stop if that causes any worsening stomach symptoms.  Hydroxyzine if needed at bedtime.  Recheck in 2 weeks. Return to the clinic or go to the nearest emergency room if any of your symptoms worsen or new symptoms occur.    IF you received an x-ray today, you will receive an invoice from [facility]. Please contact [facility] at [phone] with questions or concerns regarding your invoice.   IF you received labwork today, you will receive an invoice from [facility]. Please contact [facility] at [phone] with questions or concerns regarding your invoice.   Our billing staff will not be able to assist you with questions regarding bills from these companies.  You will be contacted with the lab results as soon as they are available. The fastest way to get your results is to activate your My Chart account. Instructions are located on the last page of this paperwork. If you have not heard from [place] regarding the results in 2 weeks, please contact this office.       I personally performed the services described in this documentation, which was scribed in my presence. The recorded information  has been reviewed and considered for accuracy and completeness, addended by me as needed, and agree with information above.  Signed,   [name], MD [facility] [place].  08/23/17 10:59 AM

## 2017-08-22 LAB — CBC
HEMATOCRIT: 41.5 % (ref 37.5–51.0)
Hemoglobin: 14.3 g/dL (ref 13.0–17.7)
MCH: 29.9 pg (ref 26.6–33.0)
MCHC: 34.5 g/dL (ref 31.5–35.7)
MCV: 87 fL (ref 79–97)
Platelets: 193 10*3/uL (ref 150–379)
RBC: 4.78 x10E6/uL (ref 4.14–5.80)
RDW: 13.7 % (ref 12.3–15.4)
WBC: 6.9 10*3/uL (ref 3.4–10.8)

## 2017-08-22 LAB — SEDIMENTATION RATE: Sed Rate: 4 mm/hr (ref 0–15)

## 2017-08-22 LAB — D-DIMER, QUANTITATIVE: D-DIMER: <0.2 mg/L FEU (ref 0.00–0.49)

## 2017-08-22 LAB — CK: Total CK: 306 U/L — ABNORMAL HIGH (ref 24–204)

## 2017-08-27 ENCOUNTER — Telehealth: Payer: Self-pay | Admitting: Family Medicine

## 2017-08-27 NOTE — Telephone Encounter (Signed)
Result note read to patient's wife; verbalizes understanding. Secured F/U appt in 2 weeks, 09/10/17 with Dr. Carlota Raspberry.  Result note was not routed to [facility].

## 2017-09-10 ENCOUNTER — Ambulatory Visit: Payer: Self-pay | Admitting: Family Medicine

## 2017-09-10 ENCOUNTER — Ambulatory Visit (INDEPENDENT_AMBULATORY_CARE_PROVIDER_SITE_OTHER): Payer: 59 | Admitting: Family Medicine

## 2017-09-10 ENCOUNTER — Encounter: Payer: Self-pay | Admitting: Family Medicine

## 2017-09-10 VITALS — BP 112/70 | HR 62 | Temp 97.8°F | Ht 73.0 in | Wt 193.0 lb

## 2017-09-10 DIAGNOSIS — Z0000 Encounter for general adult medical examination without abnormal findings: Secondary | ICD-10-CM | POA: Diagnosis not present

## 2017-09-10 DIAGNOSIS — R7303 Prediabetes: Secondary | ICD-10-CM

## 2017-09-10 DIAGNOSIS — Z23 Encounter for immunization: Secondary | ICD-10-CM | POA: Diagnosis not present

## 2017-09-10 DIAGNOSIS — R0789 Other chest pain: Secondary | ICD-10-CM | POA: Diagnosis not present

## 2017-09-10 DIAGNOSIS — G4700 Insomnia, unspecified: Secondary | ICD-10-CM | POA: Diagnosis not present

## 2017-09-10 DIAGNOSIS — Z1211 Encounter for screening for malignant neoplasm of colon: Secondary | ICD-10-CM | POA: Diagnosis not present

## 2017-09-10 MED ORDER — ZOSTER VAC RECOMB ADJUVANTED 50 MCG/0.5ML IM SUSR: 0.5000 mL | Freq: Once | INTRAMUSCULAR | 1 refills | Status: AC

## 2017-09-10 NOTE — Patient Instructions (Addendum)
I am glad to hear that the chest wall pain has improved.  As we discussed that may be due to your type of work.  If you have some mild increase in symptoms you can take the meloxicam, but if the symptoms return and are not improving please return to discuss other causes and possibly recheck the muscle enzyme test.  For difficulty with sleep, it is okay to try a half of hydroxyzine if needed to see if that is just as effective without causing you to be so sleepy when you wake up.  If you continue to have difficulties with sleep, please follow-up to discuss those further.  Most recent A1c was at prediabetes range. See info below, but would recommend repeating that test in 4 months.   Kidney function test borderline elevated in March - we can also recheck that level next visit.   Shingles vaccine sent to your pharmacy - you can have that performed after age 32.  I also will refer you for colonoscopy.   Schedule dentist visit.   Thanks for coming in today.    Keeping you healthy  Get these tests  Blood pressure- Have your blood pressure checked once a year by your healthcare provider.  Normal blood pressure is 120/80  Weight- Have your body mass index (BMI) calculated to screen for obesity.  BMI is a measure of body fat based on height and weight. You can also calculate your own BMI at [email or web address].  Cholesterol- Have your cholesterol checked every year.  Diabetes- Have your blood sugar checked regularly if you have high blood pressure, high cholesterol, have a family history of diabetes or if you are overweight.  Screening for Colon Cancer- Colonoscopy starting at age 35.  Screening may begin sooner depending on your family history and other health conditions. Follow up colonoscopy as directed by your Gastroenterologist.  Screening for Prostate Cancer- Both blood work (PSA) and a rectal exam help screen for Prostate Cancer.  Screening begins at age 77 with African-American men  and at age 24 with Caucasian men.  Screening may begin sooner depending on your family history.  Take these medicines  Aspirin- One aspirin daily can help prevent Heart disease and Stroke.  Flu shot- Every fall.  Tetanus- Every 10 years.  Shingles vaccine sent to your pharmacy.   Pneumonia shot- Once after the age of 74; if you are younger than 25, ask your healthcare provider if you need a Pneumonia shot.  Take these steps  Don't smoke- If you do smoke, talk to your doctor about quitting.  For tips on how to quit, go to www.smokefree.gov or call 1-800-QUIT-NOW.  Be physically active- Exercise 5 days a week for at least 30 minutes.  If you are not already physically active start slow and gradually work up to 30 minutes of moderate physical activity.  Examples of moderate activity include walking briskly, mowing the yard, dancing, swimming, bicycling, etc.  Eat a healthy diet- Eat a variety of healthy food such as fruits, vegetables, low fat milk, low fat cheese, yogurt, lean meant, poultry, fish, beans, tofu, etc. For more information go to www.thenutritionsource.org  Drink alcohol in moderation- Limit alcohol intake to less than two drinks a day. Never drink and drive.  Dentist- Brush and floss twice daily; visit your dentist twice a year.  Depression- Your emotional health is as important as your physical health. If you're feeling down, or losing interest in things you would normally enjoy please talk to your healthcare  provider.  Eye exam- Visit your eye doctor every year.  Safe sex- If you may be exposed to a sexually transmitted infection, use a condom.  Seat belts- Seat belts can save your life; always wear one.  Smoke/Carbon Monoxide detectors- These detectors need to be installed on the appropriate level of your home.  Replace batteries at least once a year.  Skin cancer- When out in the sun, cover up and use sunscreen 15 SPF or higher.  Violence- If anyone is  threatening you, please tell your healthcare provider.  Living Will/ Health care power of attorney- Speak with your healthcare provider and family.   Prediabetes Prediabetes is the condition of having a blood sugar (blood glucose) level that is higher than it should be, but not high enough for you to be diagnosed with type 2 diabetes. Having prediabetes puts you at risk for developing type 2 diabetes (type 2 diabetes mellitus). Prediabetes may be called impaired glucose tolerance or impaired fasting glucose. Prediabetes usually does not cause symptoms. Your health care provider can diagnose this condition with blood tests. You may be tested for prediabetes if you are overweight and if you have at least one other risk factor for prediabetes. Risk factors for prediabetes include:  Having a family member with type 2 diabetes.  Being overweight or obese.  Being older than age 34.  Being of American-Indian, African-American, Hispanic/Latino, or Asian/Pacific Islander descent.  Having an inactive (sedentary) lifestyle.  Having a history of gestational diabetes or polycystic ovarian syndrome (PCOS).  Having low levels of good cholesterol (HDL-C) or high levels of blood fats (triglycerides).  Having high blood pressure.  What is blood glucose and how is blood glucose measured?  Blood glucose refers to the amount of glucose in your bloodstream. Glucose comes from eating foods that contain sugars and starches (carbohydrates) that the body breaks down into glucose. Your blood glucose level may be measured in mg/dL (milligrams per deciliter) or mmol/L (millimoles per liter).Your blood glucose may be checked with one or more of the following blood tests:  A fasting blood glucose (FBG) test. You will not be allowed to eat (you will fast) for at least 8 hours before a blood sample is taken. ? A normal range for FBG is 70-100 mg/dl (3.9-5.6 mmol/L).  An A1c (hemoglobin A1c) blood test. This test  provides information about blood glucose control over the previous 2?89months.  An oral glucose tolerance test (OGTT). This test measures your blood glucose twice: ? After fasting. This is your baseline level. ? Two hours after you drink a beverage that contains glucose.  You may be diagnosed with prediabetes:  If your FBG is 100?125 mg/dL (5.6-6.9 mmol/L).  If your A1c level is 5.7?6.4%.  If your OGGT result is 140?199 mg/dL (7.8-11 mmol/L).  These blood tests may be repeated to confirm your diagnosis. What happens if blood glucose is too high? The pancreas produces a hormone (insulin) that helps move glucose from the bloodstream into cells. When cells in the body do not respond properly to insulin that the body makes (insulin resistance), excess glucose builds up in the blood instead of going into cells. As a result, high blood glucose (hyperglycemia) can develop, which can cause many complications. This is a symptom of prediabetes. What can happen if blood glucose stays higher than normal for a long time? Having high blood glucose for a long time is dangerous. Too much glucose in your blood can damage your nerves and blood vessels. Long-term damage  can lead to complications from diabetes, which may include:  Heart disease.  Stroke.  Blindness.  Kidney disease.  Depression.  Poor circulation in the feet and legs, which could lead to surgical removal (amputation) in severe cases.  How can prediabetes be prevented from turning into type 2 diabetes?  To help prevent type 2 diabetes, take the following actions:  Be physically active. ? Do moderate-intensity physical activity for at least 30 minutes on at least 5 days of the week, or as much as told by your health care provider. This could be brisk walking, biking, or water aerobics. ? Ask your health care provider what activities are safe for you. A mix of physical activities may be best, such as walking, swimming, cycling, and  strength training.  Lose weight as told by your health care provider. ? Losing 5-7% of your body weight can reverse insulin resistance. ? Your health care provider can determine how much weight loss is best for you and can help you lose weight safely.  Follow a healthy meal plan. This includes eating lean proteins, complex carbohydrates, fresh fruits and vegetables, low-fat dairy products, and healthy fats. ? Follow instructions from your health care provider about eating or drinking restrictions. ? Make an appointment to see a diet and nutrition specialist (registered dietitian) to help you create a healthy eating plan that is right for you.  Do not smoke or use any tobacco products, such as cigarettes, chewing tobacco, and e-cigarettes. If you need help quitting, ask your health care provider.  Take over-the-counter and prescription medicines as told by your health care provider. You may be prescribed medicines that help lower the risk of type 2 diabetes.  This information is not intended to replace advice given to you by your health care provider. Make sure you discuss any questions you have with your health care provider. Document Released: 08/06/2015 Document Revised: 09/20/2015 Document Reviewed: 06/05/2015 Elsevier Interactive Patient Education  2018 [employer].   IF you received an x-ray today, you will receive an invoice from [facility]. Please contact [facility] at [phone] with questions or concerns regarding your invoice.   IF you received labwork today, you will receive an invoice from [place]. Please contact [facility] at [phone] with questions or concerns regarding your invoice.   Our billing staff will not be able to assist you with questions regarding bills from these companies.  You will be contacted with the lab results as soon as they are available. The fastest way to get your results is to activate your My Chart account. Instructions are  located on the last page of this paperwork. If you have not heard from [place] regarding the results in 2 weeks, please contact this office.

## 2017-09-10 NOTE — Progress Notes (Signed)
Subjective:  By signing my name below, I, Essence Howell, attest that this documentation has been prepared under the direction and in the presence of Wendie Agreste, MD Electronically Signed: Ladene Artist, ED Scribe 09/10/2017 at 11:13 AM.   Patient ID: Adam Cortez, male    DOB: January 12, 1966, 52 y.o.   MRN: [id number]  Chief Complaint  Patient presents with  . Flank Pain    2 week follow up (both sides)   HPI Adam Cortez is a 52 y.o. male who presents to Primary Care at [facility] for f/u of flank pain. Seen 4/26 for a CPE. He was complaining of intermittent abdominal pain x 1 months radiating from bilateral ribs to abdomen. He had tried VapoRub, Voltaren bid. He had a neg CXR, reassuring EKG, normal d-dimer, normal sed rate and only mildly elevated CPK at 306. Thought it may be in part due to his type of work with twisting/turning. Tried on Mobic 7.5 mg qd. - Pt states that bilateral flank pain has resolved at this time, but he still has minimal R rib pain. He took Mobic for 2-3 days and noticed some relief.  Insomnia See last visit. 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 Hydroxyzine was prescribed. - Pt has tried 1 hydroxyzine tab a few nights which he states allowed him to sleep, however, he did notice some hangover effect the following morning. He has not tried taking 1/2 tab.  Pt initially presented for CPE on 4/26 but other acute issues were addressed instead.  CA Screening Colonoscopy: none; pt will turn 52 on 6/3. No family h/o colon CA. Prostate CA Screening: I do not see a recent PSA. Pt states that he has never had a prostate exam done. He wants to defer PSA and DRE to next CPE. No family h/o prostate CA. No results found for: PSA  Immunizations Immunization History  Administered Date(s) Administered  . Hepatitis A 02/05/2010, 04/05/2011  . Hepatitis B 02/05/2010, 03/08/2010, 04/05/2011  . Influenza, Seasonal, Injecte, Preservative Fre  03/26/2012  . Influenza,inj,Quad PF,6+ Mos 02/22/2014, 02/09/2016, 01/06/2017  . Meningococcal Conjugate 03/26/2012  . Meningococcal Polysaccharide 02/05/2010  . Td 10/27/2007  . Tdap 07/25/2017  Shingrix: sent to pharmacy  Depression Screening Depression screen [facility] 2/9 09/10/2017 08/21/2017 07/25/2017 01/06/2017 04/19/2016  Decreased Interest 0 0 0 0 0  Down, Depressed, Hopeless 0 0 0 0 0  PHQ - 2 Score 0 0 0 0 0    No exam data present Vision: followed annually Dentist: does not currently have a dentist Exercise: pt has a physical job, walks for exercise  Lipid Screening Lab Results  Component Value Date   CHOL 169 07/25/2017   HDL 55 07/25/2017   LDLCALC 105 (H) 07/25/2017   TRIG 45 07/25/2017   CHOLHDL 3.1 07/25/2017   Lab Results  Component Value Date   ALT 14 07/25/2017   AST 19 07/25/2017   ALKPHOS 44 07/25/2017   BILITOT <0.2 07/25/2017   Prediabetes Lab Results  Component Value Date   HGBA1C 6.1 (H) 07/25/2017  Recommended diet and regular exercise.  Elevated Creatinine  Lab Results  Component Value Date   CREATININE 1.35 (H) 07/25/2017  Improved from 1.54 in Sept 2018. eGFR 71.   Patient Active Problem List   Diagnosis Date Noted  . Chest discomfort 01/06/2017  . Atypical chest pain 01/06/2017  . Hyperlipidemia 01/25/2015  . Benign essential HTN 01/25/2015  . Erectile dysfunction 12/28/2014  . Anejaculation 12/28/2014  . 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 Allergy   . Wears glasses    Past Surgical History:  Procedure Laterality Date  . LIPOMA EXCISION Right 06/03/2013   Procedure: EXCISION LIPOMA BACK;  Surgeon: Imogene Burn. Georgette Dover, MD;  Location: [facility];  Service: General;  Laterality: Right;  . NO PAST SURGERIES     No Known Allergies Prior to Admission medications   Medication Sig Start Date End Date Taking? Authorizing Provider  hydrOXYzine (ATARAX/VISTARIL) 25 MG tablet Take  0.5-1 tablets (12.5-25 mg total) by mouth at bedtime as needed. 08/21/17   Wendie Agreste, MD  meloxicam (MOBIC) 7.5 MG tablet Take 1 tablet (7.5 mg total) by mouth daily. 08/21/17   Wendie Agreste, MD   Social History   Socioeconomic History  . Marital status: Married    Spouse name: [name]  . Number of children: 2  . Years of education: Not on file  . Highest education level: Not on file  Occupational History    Employer: [employer]  Social Needs  . Financial resource strain: Not on file  . Food insecurity:    Worry: Not on file    Inability: Not on file  . Transportation needs:    Medical: Not on file    Non-medical: Not on file  Tobacco Use  . Smoking status: Never Smoker  . Smokeless tobacco: Never Used  Substance and Sexual Activity  . Alcohol use: No  . Drug use: No  . Sexual activity: Yes  Lifestyle  . Physical activity:    Days per week: Not on file    Minutes per session: Not on file  . Stress: Not on file  Relationships  . Social connections:    Talks on phone: Not on file    Gets together: Not on file    Attends religious service: Not on file    Active member of club or organization: Not on file    Attends meetings of clubs or organizations: Not on file    Relationship status: Not on file  . Intimate partner violence:    Fear of current or ex partner: Not on file    Emotionally abused: Not on file    Physically abused: Not on file    Forced sexual activity: Not on file  Other Topics Concern  . Not on file  Social History Narrative   From [place].   Came to the [place] in 1996.   Lives with his wife and their twins (fraternal), born in 2009.   Review of Systems  Genitourinary: Flank pain: resolved.  Musculoskeletal: Positive for myalgias (mild R rib pain).  Psychiatric/Behavioral: Positive for sleep disturbance.      Objective:   Physical Exam  Constitutional: He is oriented to person, place, and time. He appears well-developed and  well-nourished.  HENT:  Head: Normocephalic and atraumatic.  Right Ear: External ear normal.  Left Ear: External ear normal.  Mouth/Throat: Oropharynx is clear and moist.  Eyes: Pupils are equal, round, and reactive to light. Conjunctivae and EOM are normal.  Neck: Normal range of motion. Neck supple. No thyromegaly present.  Cardiovascular: Normal rate, regular rhythm, normal heart sounds and intact distal pulses.  Pulmonary/Chest: Effort normal and breath sounds normal. No respiratory distress. He has no wheezes.  Very minimal tenderness in R lower rib margin. Remainder of chest is nontender.  Abdominal: Soft. He exhibits no distension. There is no tenderness.  Musculoskeletal:  Normal range of motion. He exhibits no edema or tenderness.  No calf swelling or tenderness.  Lymphadenopathy:    He has no cervical adenopathy.  Neurological: He is alert and oriented to person, place, and time. He has normal reflexes.  Skin: Skin is warm and dry.  Psychiatric: He has a normal mood and affect. His behavior is normal.  Vitals reviewed.  Vitals:   09/10/17 1043  BP: 112/70  Pulse: 62  Temp: 97.8 F (36.6 C)  TempSrc: Oral  SpO2: 99%  Weight: 193 lb (87.5 kg)  Height: 6' 1"  (1.854 m)      Assessment & Plan:    Rudolfo Brandow is a 52 y.o. male Annual physical exam  - -anticipatory guidance as below in AVS, screening labs above. Health maintenance items as above in HPI discussed/recommended as applicable.   Chest wall pain  -Improving.  Still may be related to his type of work.  RTC precautions if symptoms worsen. Has Mobic if needed for temporary flares  Insomnia, unspecified type  -Overall stable, has hydroxyzine if needed, can try lower dose to see if he has less side effects the next day.  Still think this is related to shift work  Screen for colon cancer - Plan: Ambulatory referral to Gastroenterology  Need for shingles vaccine - Plan: Zoster Vaccine Adjuvanted [facility])  injection sent to pharmacy   Prediabetes   -handout given, discussed diet/exercise, recheck A1c in 4 months.    Meds ordered this encounter  Medications  . Zoster Vaccine Adjuvanted [facility]) injection    Sig: Inject 0.5 mLs into the muscle once for 1 dose. Repeat in 2-6 months.    Dispense:  0.5 mL    Refill:  1   Patient Instructions    I am glad to hear that the chest wall pain has improved.  As we discussed that may be due to your type of work.  If you have some mild increase in symptoms you can take the meloxicam, but if the symptoms return and are not improving please return to discuss other causes and possibly recheck the muscle enzyme test.  For difficulty with sleep, it is okay to try a half of hydroxyzine if needed to see if that is just as effective without causing you to be so sleepy when you wake up.  If you continue to have difficulties with sleep, please follow-up to discuss those further.  Most recent A1c was at prediabetes range. See info below, but would recommend repeating that test in 4 months.   Kidney function test borderline elevated in March - we can also recheck that level next visit.   Shingles vaccine sent to your pharmacy - you can have that performed after age 73.  I also will refer you for colonoscopy.   Schedule dentist visit.   Thanks for coming in today.    Keeping you healthy  Get these tests  Blood pressure- Have your blood pressure checked once a year by your healthcare provider.  Normal blood pressure is 120/80  Weight- Have your body mass index (BMI) calculated to screen for obesity.  BMI is a measure of body fat based on height and weight. You can also calculate your own BMI at ViewBanking.si.  Cholesterol- Have your cholesterol checked every year.  Diabetes- Have your blood sugar checked regularly if you have high blood pressure, high cholesterol, have a family history of diabetes or if you are overweight.  Screening for  Colon Cancer- Colonoscopy starting at age  13.  Screening may begin sooner depending on your family history and other health conditions. Follow up colonoscopy as directed by your Gastroenterologist.  Screening for Prostate Cancer- Both blood work (PSA) and a rectal exam help screen for Prostate Cancer.  Screening begins at age 83 with African-American men and at age 34 with Caucasian men.  Screening may begin sooner depending on your family history.  Take these medicines  Aspirin- One aspirin daily can help prevent Heart disease and Stroke.  Flu shot- Every fall.  Tetanus- Every 10 years.  Shingles vaccine sent to your pharmacy.   Pneumonia shot- Once after the age of 42; if you are younger than 68, ask your healthcare provider if you need a Pneumonia shot.  Take these steps  Don't smoke- If you do smoke, talk to your doctor about quitting.  For tips on how to quit, go to www.smokefree.gov or call 1-800-QUIT-NOW.  Be physically active- Exercise 5 days a week for at least 30 minutes.  If you are not already physically active start slow and gradually work up to 30 minutes of moderate physical activity.  Examples of moderate activity include walking briskly, mowing the yard, dancing, swimming, bicycling, etc.  Eat a healthy diet- Eat a variety of healthy food such as fruits, vegetables, low fat milk, low fat cheese, yogurt, lean meant, poultry, fish, beans, tofu, etc. For more information go to www.thenutritionsource.org  Drink alcohol in moderation- Limit alcohol intake to less than two drinks a day. Never drink and drive.  Dentist- Brush and floss twice daily; visit your dentist twice a year.  Depression- Your emotional health is as important as your physical health. If you're feeling down, or losing interest in things you would normally enjoy please talk to your healthcare provider.  Eye exam- Visit your eye doctor every year.  Safe sex- If you may be exposed to a sexually transmitted  infection, use a condom.  Seat belts- Seat belts can save your life; always wear one.  Smoke/Carbon Monoxide detectors- These detectors need to be installed on the appropriate level of your home.  Replace batteries at least once a year.  Skin cancer- When out in the sun, cover up and use sunscreen 15 SPF or higher.  Violence- If anyone is threatening you, please tell your healthcare provider.  Living Will/ Health care power of attorney- Speak with your healthcare provider and family.   Prediabetes Prediabetes is the condition of having a blood sugar (blood glucose) level that is higher than it should be, but not high enough for you to be diagnosed with type 2 diabetes. Having prediabetes puts you at risk for developing type 2 diabetes (type 2 diabetes mellitus). Prediabetes may be called impaired glucose tolerance or impaired fasting glucose. Prediabetes usually does not cause symptoms. Your health care provider can diagnose this condition with blood tests. You may be tested for prediabetes if you are overweight and if you have at least one other risk factor for prediabetes. Risk factors for prediabetes include:  Having a family member with type 2 diabetes.  Being overweight or obese.  Being older than age 41.  Being of American-Indian, African-American, Hispanic/Latino, or Asian/Pacific Islander descent.  Having an inactive (sedentary) lifestyle.  Having a history of gestational diabetes or polycystic ovarian syndrome (PCOS).  Having low levels of good cholesterol (HDL-C) or high levels of blood fats (triglycerides).  Having high blood pressure.  What is blood glucose and how is blood glucose measured?  Blood glucose refers to  the amount of glucose in your bloodstream. Glucose comes from eating foods that contain sugars and starches (carbohydrates) that the body breaks down into glucose. Your blood glucose level may be measured in mg/dL (milligrams per deciliter) or mmol/L  (millimoles per liter).Your blood glucose may be checked with one or more of the following blood tests:  A fasting blood glucose (FBG) test. You will not be allowed to eat (you will fast) for at least 8 hours before a blood sample is taken. ? A normal range for FBG is 70-100 mg/dl (3.9-5.6 mmol/L).  An A1c (hemoglobin A1c) blood test. This test provides information about blood glucose control over the previous 2?31month.  An oral glucose tolerance test (OGTT). This test measures your blood glucose twice: ? After fasting. This is your baseline level. ? Two hours after you drink a beverage that contains glucose.  You may be diagnosed with prediabetes:  If your FBG is 100?125 mg/dL (5.6-6.9 mmol/L).  If your A1c level is 5.7?6.4%.  If your OGGT result is 140?199 mg/dL (7.8-11 mmol/L).  These blood tests may be repeated to confirm your diagnosis. What happens if blood glucose is too high? The pancreas produces a hormone (insulin) that helps move glucose from the bloodstream into cells. When cells in the body do not respond properly to insulin that the body makes (insulin resistance), excess glucose builds up in the blood instead of going into cells. As a result, high blood glucose (hyperglycemia) can develop, which can cause many complications. This is a symptom of prediabetes. What can happen if blood glucose stays higher than normal for a long time? Having high blood glucose for a long time is dangerous. Too much glucose in your blood can damage your nerves and blood vessels. Long-term damage can lead to complications from diabetes, which may include:  Heart disease.  Stroke.  Blindness.  Kidney disease.  Depression.  Poor circulation in the feet and legs, which could lead to surgical removal (amputation) in severe cases.  How can prediabetes be prevented from turning into type 2 diabetes?  To help prevent type 2 diabetes, take the following actions:  Be physically  active. ? Do moderate-intensity physical activity for at least 30 minutes on at least 5 days of the week, or as much as told by your health care provider. This could be brisk walking, biking, or water aerobics. ? Ask your health care provider what activities are safe for you. A mix of physical activities may be best, such as walking, swimming, cycling, and strength training.  Lose weight as told by your health care provider. ? Losing 5-7% of your body weight can reverse insulin resistance. ? Your health care provider can determine how much weight loss is best for you and can help you lose weight safely.  Follow a healthy meal plan. This includes eating lean proteins, complex carbohydrates, fresh fruits and vegetables, low-fat dairy products, and healthy fats. ? Follow instructions from your health care provider about eating or drinking restrictions. ? Make an appointment to see a diet and nutrition specialist (registered dietitian) to help you create a healthy eating plan that is right for you.  Do not smoke or use any tobacco products, such as cigarettes, chewing tobacco, and e-cigarettes. If you need help quitting, ask your health care provider.  Take over-the-counter and prescription medicines as told by your health care provider. You may be prescribed medicines that help lower the risk of type 2 diabetes.  This information is not intended to  replace advice given to you by your health care provider. Make sure you discuss any questions you have with your health care provider. Document Released: 08/06/2015 Document Revised: 09/20/2015 Document Reviewed: 06/05/2015 Elsevier Interactive Patient Education  2018 [employer].   IF you received an x-ray today, you will receive an invoice from [facility]. Please contact [facility] at [phone] with questions or concerns regarding your invoice.   IF you received labwork today, you will receive an invoice from [facility]. Please  contact [facility] at [phone] with questions or concerns regarding your invoice.   Our billing staff will not be able to assist you with questions regarding bills from these companies.  You will be contacted with the lab results as soon as they are available. The fastest way to get your results is to activate your My Chart account. Instructions are located on the last page of this paperwork. If you have not heard from [place] regarding the results in 2 weeks, please contact this office.       I personally performed the services described in this documentation, which was scribed in my presence. The recorded information has been reviewed and considered for accuracy and completeness, addended by me as needed, and agree with information above.  Signed,   [name], MD [facility].  09/11/17 8:13 AM

## 2017-09-11 ENCOUNTER — Encounter: Payer: Self-pay | Admitting: Family Medicine

## 2017-09-24 ENCOUNTER — Encounter: Payer: Self-pay | Admitting: Internal Medicine

## 2017-11-30 ENCOUNTER — Ambulatory Visit (AMBULATORY_SURGERY_CENTER): Payer: Self-pay | Admitting: *Deleted

## 2017-11-30 VITALS — Ht 76.0 in | Wt 208.6 lb

## 2017-11-30 DIAGNOSIS — Z1211 Encounter for screening for malignant neoplasm of colon: Secondary | ICD-10-CM

## 2017-11-30 MED ORDER — SUPREP BOWEL PREP KIT 17.5-3.13-1.6 GM/177ML PO SOLN: 1.0000 | Freq: Once | ORAL | 0 refills | Status: AC

## 2017-11-30 NOTE — Progress Notes (Signed)
Patient has his consent form and previsit form. Will bring at time of visit.

## 2017-11-30 NOTE — Progress Notes (Signed)
No egg or soy allergy known to patient  No issues with past sedation with any surgeries  or procedures, no intubation problems  No diet pills per patient No home 02 use per patient  No blood thinners per patient  Pt denies issues with constipation  No A fib or A flutter  EMMI video sent to pt's e mail  

## 2017-12-02 ENCOUNTER — Encounter: Payer: Self-pay | Admitting: Internal Medicine

## 2017-12-14 ENCOUNTER — Ambulatory Visit (AMBULATORY_SURGERY_CENTER): Payer: 59 | Admitting: Internal Medicine

## 2017-12-14 ENCOUNTER — Encounter: Payer: Self-pay | Admitting: Internal Medicine

## 2017-12-14 VITALS — BP 118/70 | HR 50 | Temp 97.1°F | Resp 17 | Ht 76.0 in | Wt 208.0 lb

## 2017-12-14 DIAGNOSIS — D124 Benign neoplasm of descending colon: Secondary | ICD-10-CM

## 2017-12-14 DIAGNOSIS — Z1211 Encounter for screening for malignant neoplasm of colon: Secondary | ICD-10-CM

## 2017-12-14 DIAGNOSIS — D123 Benign neoplasm of transverse colon: Secondary | ICD-10-CM

## 2017-12-14 DIAGNOSIS — D120 Benign neoplasm of cecum: Secondary | ICD-10-CM

## 2017-12-14 MED ORDER — SODIUM CHLORIDE 0.9 % IV SOLN: 500.0000 mL | Freq: Once | INTRAVENOUS | Status: DC

## 2017-12-14 NOTE — Progress Notes (Signed)
Report given to PACU, vss 

## 2017-12-14 NOTE — Op Note (Signed)
[place] Patient Name: Adam Cortez Procedure Date: 12/14/2017 9:18 AM MRN: [id number] Endoscopist: Jerene Bears , MD Age: 52 Referring MD:  Date of Birth: 1965/10/29 Gender: Male Account #: [id number] Procedure:                Colonoscopy Indications:              Screening for colorectal malignant neoplasm, This                            is the patient's first colonoscopy Medicines:                Monitored Anesthesia Care Procedure:                Pre-Anesthesia Assessment:                           - Prior to the procedure, a History and Physical                            was performed, and patient medications and                            allergies were reviewed. The patient's tolerance of                            previous anesthesia was also reviewed. The risks                            and benefits of the procedure and the sedation                            options and risks were discussed with the patient.                            All questions were answered, and informed consent                            was obtained. Prior Anticoagulants: The patient has                            taken no previous anticoagulant or antiplatelet                            agents. ASA Grade Assessment: II - A patient with                            mild systemic disease. After reviewing the risks                            and benefits, the patient was deemed in                            satisfactory condition to undergo the procedure.  After obtaining informed consent, the colonoscope                            was passed under direct vision. Throughout the                            procedure, the patient's blood pressure, pulse, and                            oxygen saturations were monitored continuously. The                            Colonoscope was introduced through the anus and                            advanced to the cecum, identified  by appendiceal                            orifice and ileocecal valve. The colonoscopy was                            performed without difficulty. The patient tolerated                            the procedure well. The quality of the bowel                            preparation was good. The ileocecal valve,                            appendiceal orifice, and rectum were photographed. Scope In: 9:23:22 AM Scope Out: 9:41:33 AM Scope Withdrawal Time: 0 hours 15 minutes 39 seconds  Total Procedure Duration: 0 hours 18 minutes 11 seconds  Findings:                 The digital rectal exam was normal.                           A 2 mm polyp was found in the cecum. The polyp was                            sessile. The polyp was removed with a cold snare.                            Resection and retrieval were complete.                           A 3 mm polyp was found in the hepatic flexure. The                            polyp was sessile. The polyp was removed with a  cold snare. Resection and retrieval were complete.                           Two sessile polyps were found in the transverse                            colon. The polyps were 3 to 4 mm in size. These                            polyps were removed with a cold snare. Resection                            and retrieval were complete.                           Two sessile polyps were found in the descending                            colon. The polyps were 3 to 4 mm in size. These                            polyps were removed with a cold snare. Resection                            and retrieval were complete.                           A 3 mm polyp was found in the rectum. The polyp was                            sessile. The polyp was removed with a cold snare.                            Resection and retrieval were complete.                           Internal hemorrhoids were found during                             retroflexion. The hemorrhoids were small. Complications:            No immediate complications. Estimated Blood Loss:     Estimated blood loss was minimal. Impression:               - One 2 mm polyp in the cecum, removed with a cold                            snare. Resected and retrieved.                           - One 3 mm polyp at the hepatic flexure, removed  with a cold snare. Resected and retrieved.                           - Two 3 to 4 mm polyps in the transverse colon,                            removed with a cold snare. Resected and retrieved.                           - Two 3 to 4 mm polyps in the descending colon,                            removed with a cold snare. Resected and retrieved.                           - One 3 mm polyp in the rectum, removed with a cold                            snare. Resected and retrieved.                           - Internal hemorrhoids. Recommendation:           - Patient has a contact number available for                            emergencies. The signs and symptoms of potential                            delayed complications were discussed with the                            patient. Return to normal activities tomorrow.                            Written discharge instructions were provided to the                            patient.                           - Resume previous diet.                           - Continue present medications.                           - Await pathology results.                           - Repeat colonoscopy is recommended. The                            colonoscopy date will be determined after pathology  results from today's exam become available for                            review. Jerene Bears, MD 12/14/2017 9:45:25 AM This report has been signed electronically.

## 2017-12-14 NOTE — Progress Notes (Signed)
Called to room to assist during endoscopic procedure.  Patient ID and intended procedure confirmed with present staff. Received instructions for my participation in the procedure from the performing physician.  

## 2017-12-14 NOTE — Patient Instructions (Signed)
Handouts given on polyps and hemorrhoids   YOU HAD AN ENDOSCOPIC PROCEDURE TODAY AT THE [facility]:   Refer to the procedure report that was given to you for any specific questions about what was found during the examination.  If the procedure report does not answer your questions, please call your gastroenterologist to clarify.  If you requested that your care partner not be given the details of your procedure findings, then the procedure report has been included in a sealed envelope for you to review at your convenience later.  YOU SHOULD EXPECT: Some feelings of bloating in the abdomen. Passage of more gas than usual.  Walking can help get rid of the air that was put into your GI tract during the procedure and reduce the bloating. If you had a lower endoscopy (such as a colonoscopy or flexible sigmoidoscopy) you may notice spotting of blood in your stool or on the toilet paper. If you underwent a bowel prep for your procedure, you may not have a normal bowel movement for a few days.  Please Note:  You might notice some irritation and congestion in your nose or some drainage.  This is from the oxygen used during your procedure.  There is no need for concern and it should clear up in a day or so.  SYMPTOMS TO REPORT IMMEDIATELY:   Following lower endoscopy (colonoscopy or flexible sigmoidoscopy):  Excessive amounts of blood in the stool  Significant tenderness or worsening of abdominal pains  Swelling of the abdomen that is new, acute  Fever of 100F or higher    For urgent or emergent issues, a gastroenterologist can be reached at any hour by calling [phone].   DIET:  We do recommend a small meal at first, but then you may proceed to your regular diet.  Drink plenty of fluids but you should avoid alcoholic beverages for 24 hours.  ACTIVITY:  You should plan to take it easy for the rest of today and you should NOT DRIVE or use heavy machinery until tomorrow (because of  the sedation medicines used during the test).    FOLLOW UP: Our staff will call the number listed on your records the next business day following your procedure to check on you and address any questions or concerns that you may have regarding the information given to you following your procedure. If we do not reach you, we will leave a message.  However, if you are feeling well and you are not experiencing any problems, there is no need to return our call.  We will assume that you have returned to your regular daily activities without incident.  If any biopsies were taken you will be contacted by phone or by letter within the next 1-3 weeks.  Please call us at [phone] if you have not heard about the biopsies in 3 weeks.    SIGNATURES/CONFIDENTIALITY: You and/or your care partner have signed paperwork which will be entered into your electronic medical record.  These signatures attest to the fact that that the information above on your After Visit Summary has been reviewed and is understood.  Full responsibility of the confidentiality of this discharge information lies with you and/or your care-partner. 

## 2017-12-14 NOTE — Progress Notes (Signed)
Pt's states no medical or surgical changes since previsit or office visit. 

## 2017-12-15 ENCOUNTER — Telehealth: Payer: Self-pay | Admitting: *Deleted

## 2017-12-15 NOTE — Telephone Encounter (Signed)
  Follow up Call-  Call back number 12/14/2017  Post procedure Call Back phone  # [phone]  Permission to leave phone message Yes  Some recent data might be hidden     Patient questions:  Do you have a fever, pain , or abdominal swelling? No. Pain Score  0 *  Have you tolerated food without any problems? Yes.    Have you been able to return to your normal activities? Yes.    Do you have any questions about your discharge instructions: Diet   No. Medications  No. Follow up visit  No.  Do you have questions or concerns about your Care? No.  Actions: * If pain score is 4 or above: No action needed, pain <4.

## 2017-12-21 ENCOUNTER — Encounter: Payer: Self-pay | Admitting: Internal Medicine

## 2018-01-09 ENCOUNTER — Encounter: Payer: Self-pay | Admitting: Family Medicine

## 2018-01-09 ENCOUNTER — Ambulatory Visit (INDEPENDENT_AMBULATORY_CARE_PROVIDER_SITE_OTHER): Payer: 59 | Admitting: Family Medicine

## 2018-01-09 ENCOUNTER — Other Ambulatory Visit: Payer: Self-pay

## 2018-01-09 ENCOUNTER — Encounter

## 2018-01-09 VITALS — BP 129/70 | HR 54 | Temp 98.0°F | Ht 75.0 in | Wt 202.4 lb

## 2018-01-09 DIAGNOSIS — M791 Myalgia, unspecified site: Secondary | ICD-10-CM | POA: Diagnosis not present

## 2018-01-09 DIAGNOSIS — T148XXA Other injury of unspecified body region, initial encounter: Secondary | ICD-10-CM

## 2018-01-09 DIAGNOSIS — X503XXA Overexertion from repetitive movements, initial encounter: Secondary | ICD-10-CM

## 2018-01-09 DIAGNOSIS — M545 Low back pain, unspecified: Secondary | ICD-10-CM

## 2018-01-09 MED ORDER — CYCLOBENZAPRINE HCL 5 MG PO TABS: ORAL_TABLET | ORAL | 2 refills | Status: DC

## 2018-01-09 MED ORDER — DICLOFENAC SODIUM 75 MG PO TBEC: 75.0000 mg | DELAYED_RELEASE_TABLET | Freq: Two times a day (BID) | ORAL | 2 refills | Status: DC

## 2018-01-09 NOTE — Patient Instructions (Addendum)
   We will try replacing the meloxicam with diclofenac 75 mg 1 twice daily for pain and inflammation  Take cyclobenzaprine 5 mg 1 or 2 pills at bedtime for muscle relaxant as needed  In addition to these you can take Tylenol (acetaminophen) 500 mg pills 3 times daily if you are aching a lot.  If you are persisting with having a lot of pain or any other problems please come back.  If you have lab work done today you will be contacted with your lab results within the next 2 weeks.  If you have not heard from [place] then please contact us. The fastest way to get your results is to register for My Chart.   IF you received an x-ray today, you will receive an invoice from [facility]. Please contact [facility] at [phone] with questions or concerns regarding your invoice.   IF you received labwork today, you will receive an invoice from [place]. Please contact [facility] at [phone] with questions or concerns regarding your invoice.   Our billing staff will not be able to assist you with questions regarding bills from these companies.  You will be contacted with the lab results as soon as they are available. The fastest way to get your results is to activate your My Chart account. Instructions are located on the last page of this paperwork. If you have not heard from [place] regarding the results in 2 weeks, please contact this office.

## 2018-01-09 NOTE — Progress Notes (Signed)
Patient ID: Adam Cortez, male    DOB: Sep 16, 1965  Age: 52 y.o. MRN: [id number]  Chief Complaint  Patient presents with  . Back Pain  . ribs hurt    staying hurt (constant)  . face jaw and head pain    going on acouple of weeks    Subjective:   52 year old man who works at an [employer].  He is from [place], but lives here in [place] though he goes back area adequately.  He has no major injuries.  He has been having more problems though with body aches.  He hurts in his rib cage.  He is hurting in his low back.  He hurts in his jaws and around the sides of his head.  The meloxicam does not seem to help much.  Current allergies, medications, problem list, past/family and social histories reviewed.  Objective:  BP 129/70 (BP Location: Right Arm, Patient Position: Sitting, Cuff Size: Normal)   Pulse (!) 54   Temp 98 F (36.7 C) (Oral)   Ht 6\' 3"  (1.905 m)   Wt 202 lb 6.4 oz (91.8 kg)   SpO2 91%   BMI 25.30 kg/m   No acute distress.  Alert and oriented.  Blood pressure good.  TMs normal.  Throat clear.  Eyes PRL.  Neck supple without nodes or tenderness.  TMJs are nontender.  Scalp is nontender to palpation.  Chest is clear to auscultation.  Heart regular without murmurs.  No CVA tenderness.  Straight leg raise test negative.  Chest wall causes some pain with pressure to it.  Assessment & Plan:   Assessment: 1. Myalgia   2. Overuse syndrome   3. 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  See instructions.  No orders of the defined types were placed in this encounter.   No orders of the defined types were placed in this encounter.        Patient Instructions     We will try replacing the meloxicam with diclofenac 75 mg 1 twice daily for pain and inflammation  Take cyclobenzaprine 5 mg 1 or 2 pills at bedtime for muscle relaxant as needed  In  addition to these you can take Tylenol (acetaminophen) 500 mg pills 3 times daily if you are aching a lot.  If you are persisting with having a lot of pain or any other problems please come back.  If you have lab work done today you will be contacted with your lab results within the next 2 weeks.  If you have not heard from [place] then please contact us. The fastest way to get your results is to register for My Chart.   IF you received an x-ray today, you will receive an invoice from [facility]. Please contact [facility] at [phone] with questions or concerns regarding your invoice.   IF you received labwork today, you will receive an invoice from [place]. Please contact [facility] at [phone] with questions or concerns regarding your invoice.   Our billing staff will not be able to assist you with questions regarding bills from these companies.  You will be contacted with the lab results as soon as they are available. The fastest way to get your results is to activate your My Chart account. Instructions are located on the last page of this paperwork. If you have not heard from [place] regarding the results in 2  weeks, please contact this office.         Return if symptoms worsen or fail to improve.   Ruben Reason, MD 01/09/2018

## 2018-01-18 ENCOUNTER — Ambulatory Visit: Payer: 59 | Admitting: Family Medicine

## 2019-02-14 ENCOUNTER — Other Ambulatory Visit: Payer: Self-pay

## 2019-02-14 ENCOUNTER — Ambulatory Visit (INDEPENDENT_AMBULATORY_CARE_PROVIDER_SITE_OTHER): Payer: BC Managed Care – PPO | Admitting: Family Medicine

## 2019-02-14 ENCOUNTER — Encounter: Payer: Self-pay | Admitting: Family Medicine

## 2019-02-14 VITALS — BP 139/89 | HR 59 | Temp 98.4°F | Wt 208.0 lb

## 2019-02-14 DIAGNOSIS — Z0001 Encounter for general adult medical examination with abnormal findings: Secondary | ICD-10-CM

## 2019-02-14 DIAGNOSIS — Z0000 Encounter for general adult medical examination without abnormal findings: Secondary | ICD-10-CM

## 2019-02-14 DIAGNOSIS — Z23 Encounter for immunization: Secondary | ICD-10-CM | POA: Diagnosis not present

## 2019-02-14 DIAGNOSIS — Z125 Encounter for screening for malignant neoplasm of prostate: Secondary | ICD-10-CM | POA: Diagnosis not present

## 2019-02-14 DIAGNOSIS — Z7184 Encounter for health counseling related to travel: Secondary | ICD-10-CM

## 2019-02-14 DIAGNOSIS — E785 Hyperlipidemia, unspecified: Secondary | ICD-10-CM

## 2019-02-14 DIAGNOSIS — R7303 Prediabetes: Secondary | ICD-10-CM | POA: Diagnosis not present

## 2019-02-14 NOTE — Patient Instructions (Addendum)
Please let me know when you plan to travel and how long so I can order appropriate meds.   Plan to decrease the results as well as white rice in the diet.  Brown rice, other complex carbohydrates may be healthier.   Thanks for coming in today.   Keeping you healthy  Get these tests  Blood pressure- Have your blood pressure checked once a year by your healthcare provider.  Normal blood pressure is 120/80  Weight- Have your body mass index (BMI) calculated to screen for obesity.  BMI is a measure of body fat based on height and weight. You can also calculate your own BMI at [email or web address].  Cholesterol- Have your cholesterol checked every year.  Diabetes- Have your blood sugar checked regularly if you have high blood pressure, high cholesterol, have a family history of diabetes or if you are overweight.  Screening for Colon Cancer- Colonoscopy starting at age 74.  Screening may begin sooner depending on your family history and other health conditions. Follow up colonoscopy as directed by your Gastroenterologist.  Screening for Prostate Cancer- Both blood work (PSA) and a rectal exam help screen for Prostate Cancer.  Screening begins at age 73 with African-American men and at age 99 with Caucasian men.  Screening may begin sooner depending on your family history.  Take these medicines  Aspirin- One aspirin daily can help prevent Heart disease and Stroke.  Flu shot- Every fall.  Tetanus- Every 10 years.  Zostavax- Once after the age of 8 to prevent Shingles.  Pneumonia shot- Once after the age of 65; if you are younger than 67, ask your healthcare provider if you need a Pneumonia shot.  Take these steps  Don't smoke- If you do smoke, talk to your doctor about quitting.  For tips on how to quit, go to www.smokefree.gov or call 1-800-QUIT-NOW.  Be physically active- Exercise 5 days a week for at least 30 minutes.  If you are not already physically active start slow and  gradually work up to 30 minutes of moderate physical activity.  Examples of moderate activity include walking briskly, mowing the yard, dancing, swimming, bicycling, etc.  Eat a healthy diet- Eat a variety of healthy food such as fruits, vegetables, low fat milk, low fat cheese, yogurt, lean meant, poultry, fish, beans, tofu, etc. For more information go to www.thenutritionsource.org  Drink alcohol in moderation- Limit alcohol intake to less than two drinks a day. Never drink and drive.  Dentist- Brush and floss twice daily; visit your dentist twice a year.  Depression- Your emotional health is as important as your physical health. If you're feeling down, or losing interest in things you would normally enjoy please talk to your healthcare provider.  Eye exam- Visit your eye doctor every year.  Safe sex- If you may be exposed to a sexually transmitted infection, use a condom.  Seat belts- Seat belts can save your life; always wear one.  Smoke/Carbon Monoxide detectors- These detectors need to be installed on the appropriate level of your home.  Replace batteries at least once a year.  Skin cancer- When out in the sun, cover up and use sunscreen 15 SPF or higher.  Violence- If anyone is threatening you, please tell your healthcare provider.  Living Will/ Health care power of attorney- Speak with your healthcare provider and family.  Prediabetes Prediabetes is the condition of having a blood sugar (blood glucose) level that is higher than it should be, but not high enough for you  to be diagnosed with type 2 diabetes. Having prediabetes puts you at risk for developing type 2 diabetes (type 2 diabetes mellitus). Prediabetes may be called impaired glucose tolerance or impaired fasting glucose. Prediabetes usually does not cause symptoms. Your health care provider can diagnose this condition with blood tests. You may be tested for prediabetes if you are overweight and if you have at least one  other risk factor for prediabetes. What is blood glucose, and how is it measured? Blood glucose refers to the amount of glucose in your bloodstream. Glucose comes from eating foods that contain sugars and starches (carbohydrates), which the body breaks down into glucose. Your blood glucose level may be measured in mg/dL (milligrams per deciliter) or mmol/L (millimoles per liter). Your blood glucose may be checked with one or more of the following blood tests:  A fasting blood glucose (FBG) test. You will not be allowed to eat (you will fast) for 8 hours or longer before a blood sample is taken. ? A normal range for FBG is 70-100 mg/dl (3.9-5.6 mmol/L).  An A1c (hemoglobin A1c) blood test. This test provides information about blood glucose control over the previous 2?39months.  An oral glucose tolerance test (OGTT). This test measures your blood glucose at two times: ? After fasting. This is your baseline level. ? Two hours after you drink a beverage that contains glucose. You may be diagnosed with prediabetes:  If your FBG is 100?125 mg/dL (5.6-6.9 mmol/L).  If your A1c level is 5.7?6.4%.  If your OGTT result is 140?199 mg/dL (7.8-11 mmol/L). These blood tests may be repeated to confirm your diagnosis. How can this condition affect me? The pancreas produces a hormone (insulin) that helps to move glucose from the bloodstream into cells. When cells in the body do not respond properly to insulin that the body makes (insulin resistance), excess glucose builds up in the blood instead of going into cells. As a result, high blood glucose (hyperglycemia) can develop, which can cause many complications. Hyperglycemia is a symptom of prediabetes. Having high blood glucose for a long time is dangerous. Too much glucose in your blood can damage your nerves and blood vessels. Long-term damage can lead to complications from diabetes, which may include:  Heart disease.  Stroke.  Blindness.  Kidney  disease.  Depression.  Poor circulation in the feet and legs, which could lead to surgical removal (amputation) in severe cases. What can increase my risk? Risk factors for prediabetes include:  Having a family member with type 2 diabetes.  Being overweight or obese.  Being older than age 73.  Being of American [place], African-American, Hispanic/Latino, or Asian/Pacific Islander descent.  Having an inactive (sedentary) lifestyle.  Having a history of heart disease.  History of gestational diabetes or polycystic ovary syndrome (PCOS), in women.  Having low levels of good cholesterol (HDL-C) or high levels of blood fats (triglycerides).  Having high blood pressure. What actions can I take to prevent diabetes?      Be physically active. ? Do moderate-intensity physical activity for 30 or more minutes on 5 or more days of the week, or as much as told by your health care provider. This could be brisk walking, biking, or water aerobics. ? Ask your health care provider what activities are safe for you. A mix of physical activities may be best, such as walking, swimming, cycling, and strength training.  Lose weight as told by your health care provider. ? Losing 5-7% of your body weight can reverse  insulin resistance. ? Your health care provider can determine how much weight loss is best for you and can help you lose weight safely.  Follow a healthy meal plan. This includes eating lean proteins, complex carbohydrates, fresh fruits and vegetables, low-fat dairy products, and healthy fats. ? Follow instructions from your health care provider about eating or drinking restrictions. ? Make an appointment to see a diet and nutrition specialist (registered dietitian) to help you create a healthy eating plan that is right for you.  Do not smoke or use any tobacco products, such as cigarettes, chewing tobacco, and e-cigarettes. If you need help quitting, ask your health care provider.  Take  over-the-counter and prescription medicines as told by your health care provider. You may be prescribed medicines that help lower the risk of type 2 diabetes.  Keep all follow-up visits as told by your health care provider. This is important. Summary  Prediabetes is the condition of having a blood sugar (blood glucose) level that is higher than it should be, but not high enough for you to be diagnosed with type 2 diabetes.  Having prediabetes puts you at risk for developing type 2 diabetes (type 2 diabetes mellitus).  To help prevent type 2 diabetes, make lifestyle changes such as being physically active and eating a healthy diet. Lose weight as told by your health care provider. This information is not intended to replace advice given to you by your health care provider. Make sure you discuss any questions you have with your health care provider. Document Released: 08/06/2015 Document Revised: 08/06/2018 Document Reviewed: 06/05/2015 Elsevier Patient Education  [employer].     If you have lab work done today you will be contacted with your lab results within the next 2 weeks.  If you have not heard from [place] then please contact us. The fastest way to get your results is to register for My Chart.   IF you received an x-ray today, you will receive an invoice from [facility]. Please contact [facility] at [phone] with questions or concerns regarding your invoice.   IF you received labwork today, you will receive an invoice from [facility]. Please contact [facility] at [phone] with questions or concerns regarding your invoice.   Our billing staff will not be able to assist you with questions regarding bills from these companies.  You will be contacted with the lab results as soon as they are available. The fastest way to get your results is to activate your My Chart account. Instructions are located on the last page of this paperwork. If you have not heard from  [place] regarding the results in 2 weeks, please contact this office.

## 2019-02-14 NOTE — Progress Notes (Signed)
Subjective:    Patient ID: Adam Cortez, male    DOB: 08-13-1965, 53 y.o.   MRN: [id number]  HPI  Adam Cortez is a 53 y.o. male Presents today for: Chief Complaint  Patient presents with  . 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  Elevated creatinine BP Readings from Last 3 Encounters:  02/14/19 139/89  01/09/18 129/70  12/14/17 118/70  no home  Lab Results  Component Value Date   CREATININE 1.35 (H) 07/25/2017  Up to 1.54 in September 2018.   Prediabetes: Lab Results  Component Value Date   HGBA1C 6.1 (H) 07/25/2017   Wt Readings from Last 3 Encounters:  02/14/19 208 lb (94.3 kg)  01/09/18 202 lb 6.4 oz (91.8 kg)  12/14/17 208 lb (94.3 kg)  exercise on weekends - 1-1.5 hrs on weekends, physical work during week.  Fast food - rare.  No soda/sweet tea. Does eat desert, rice 7 days per week.   Hyperlipidemia:  Lab Results  Component Value Date   CHOL 169 07/25/2017   HDL 55 07/25/2017   LDLCALC 105 (H) 07/25/2017   TRIG 45 07/25/2017   CHOLHDL 3.1 07/25/2017   Lab Results  Component Value Date   ALT 14 07/25/2017   AST 19 07/25/2017   ALKPHOS 44 07/25/2017   BILITOT <0.2 07/25/2017  The 10-year ASCVD risk score Mikey Bussing DC Jr., et al., 2013) is: 6%   Values used to calculate the score:     Age: 16 years     Sex: Male     Is Non-Hispanic African American: Yes     Diabetic: No     Tobacco smoker: No     Systolic Blood Pressure: [id number] mmHg     Is BP treated: No     HDL Cholesterol: 55 mg/dL     Total Cholesterol: 169 mg/dL  Cancer screening: Colonoscopy 12/14/2017, repeat in 3 yrs.  Prostate:no FH of prostate CA. Agrees to testing after R/B discussion.   Immunization History  Administered Date(s) Administered  . Hepatitis A 02/05/2010, 04/05/2011  . Hepatitis B 02/05/2010, 03/08/2010,  04/05/2011  . Influenza, Seasonal, Injecte, Preservative Fre 03/26/2012  . Influenza,inj,Quad PF,6+ Mos 02/22/2014, 02/09/2016, 01/06/2017  . Meningococcal Conjugate 03/26/2012  . Meningococcal Polysaccharide 02/05/2010  . Td 10/27/2007  . Tdap 07/25/2017  Shingles: requests  Flu vaccine given today  Depression screen [facility] 2/9 02/14/2019 01/09/2018 09/10/2017 08/21/2017 07/25/2017  Decreased Interest 0 0 0 0 0  Down, Depressed, Hopeless 0 0 0 0 0  PHQ - 2 Score 0 0 0 0 0    Hearing Screening   125Hz  250Hz  500Hz  1000Hz  2000Hz  3000Hz  4000Hz  6000Hz  8000Hz   Right ear:           Left ear:             Visual Acuity Screening   Right eye Left eye Both eyes  Without correction: 20/25 20/25 20/25   With correction:     appt last year. Yearly eval.   Dental: no recent visit. Plans to establish.   Exercise: As above.   Plans to go back to [place] in next 2-3 months.up to 3 months.  [place], [place]. Took doxycycline prior.    Patient Active Problem List   Diagnosis Date Noted  . Chest discomfort 01/06/2017  . Atypical chest pain 01/06/2017  .  Hyperlipidemia 01/25/2015  . Benign essential HTN 01/25/2015  . Erectile dysfunction 12/28/2014  . Anejaculation 12/28/2014  . 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 Allergy   . Hyperlipidemia   . Wears glasses    Past Surgical History:  Procedure Laterality Date  . LIPOMA EXCISION Right 06/03/2013   Procedure: EXCISION LIPOMA BACK;  Surgeon: Imogene Burn. Georgette Dover, MD;  Location: [facility];  Service: General;  Laterality: Right;  . NO PAST SURGERIES     No Known Allergies Prior to Admission medications   Medication Sig Start Date End Date Taking? Authorizing Provider  cyclobenzaprine (FLEXERIL) 5 MG tablet Take 1 or 2 pills at bedtime as needed for muscle relaxant 01/09/18   Posey Boyer, MD  diclofenac (VOLTAREN) 75 MG EC tablet Take 1 tablet (75 mg total) by mouth 2 (two) times  daily. 01/09/18   Posey Boyer, MD   Social History   Socioeconomic History  . Marital status: Married    Spouse name: [name]  . Number of children: 2  . Years of education: Not on file  . Highest education level: Not on file  Occupational History    Employer: [employer]  Social Needs  . Financial resource strain: Not on file  . Food insecurity    Worry: Not on file    Inability: Not on file  . Transportation needs    Medical: Not on file    Non-medical: Not on file  Tobacco Use  . Smoking status: Never Smoker  . Smokeless tobacco: Never Used  Substance and Sexual Activity  . Alcohol use: No  . Drug use: No  . Sexual activity: Yes  Lifestyle  . Physical activity    Days per week: Not on file    Minutes per session: Not on file  . Stress: Not on file  Relationships  . Social Herbalist on phone: Not on file    Gets together: Not on file    Attends religious service: Not on file    Active member of club or organization: Not on file    Attends meetings of clubs or organizations: Not on file    Relationship status: Not on file  . Intimate partner violence    Fear of current or ex partner: Not on file    Emotionally abused: Not on file    Physically abused: Not on file    Forced sexual activity: Not on file  Other Topics Concern  . Not on file  Social History Narrative   From [place].   Came to the [place] in 1996.   Lives with his wife and their twins (fraternal), born in 2009.    Review of Systems 13 point review of systems per patient health survey noted.  Negative other than as indicated above or in HPI.      Objective:   Physical Exam Vitals signs reviewed.  Constitutional:      Appearance: He is well-developed.  HENT:     Head: Normocephalic and atraumatic.     Right Ear: External ear normal.     Left Ear: External ear normal.  Eyes:     Conjunctiva/sclera: Conjunctivae normal.     Pupils: Pupils are equal, round, and reactive to light.   Neck:     Musculoskeletal: Normal range of motion and neck supple.     Thyroid: No thyromegaly.  Cardiovascular:     Rate  and Rhythm: Normal rate and regular rhythm.     Heart sounds: Normal heart sounds.  Pulmonary:     Effort: Pulmonary effort is normal. No respiratory distress.     Breath sounds: Normal breath sounds. No wheezing.  Abdominal:     General: There is no distension.     Palpations: Abdomen is soft.     Tenderness: There is no abdominal tenderness.     Hernia: There is no hernia in the left inguinal area or right inguinal area.  Genitourinary:    Prostate: Normal.  Musculoskeletal: Normal range of motion.        General: No tenderness.  Lymphadenopathy:     Cervical: No cervical adenopathy.  Skin:    General: Skin is warm and dry.  Neurological:     Mental Status: He is alert and oriented to person, place, and time.     Deep Tendon Reflexes: Reflexes are normal and symmetric.  Psychiatric:        Behavior: Behavior normal.    Vitals:   02/14/19 0945 02/14/19 0954  BP: (!) 144/88 139/89  Pulse: (!) 59   Temp: 98.4 F (36.9 C)   TempSrc: Oral   SpO2: 99%   Weight: 208 lb (94.3 kg)         Assessment & Plan:   Adam Cortez is a 53 y.o. male Annual physical exam  - -anticipatory guidance as below in AVS, screening labs above. Health maintenance items as above in HPI discussed/recommended as applicable.   Need for prophylactic vaccination and inoculation against influenza - Plan: Flu Vaccine QUAD 6+ mos PF IM (Fluarix Quad PF)  Screening for malignant neoplasm of prostate - Plan: PSA  - We discussed pros and cons of prostate cancer screening, and after this discussion, he chose to have screening done. PSA obtained, and no concerning findings on DRE.   Prediabetes - Plan: Hemoglobin A1c  - diet changes discussed with decrease in white rice, dessert. Recheck in 6 months.   Hyperlipidemia, unspecified hyperlipidemia type - Plan: Lipid Panel,  Comprehensive metabolic panel  - mild elevation prior. Repeat labs.   Counseling about travel  - plans to call/let me know dates of travel. Likely will need rx vivotif for typhoid, doxycycline for malaria prophylaxis.   No orders of the defined types were placed in this encounter.  Patient Instructions     Please let me know when you plan to travel and how long so I can order appropriate meds.   Plan to decrease the results as well as white rice in the diet.  Brown rice, other complex carbohydrates may be healthier.   Thanks for coming in today.   Keeping you healthy  Get these tests  Blood pressure- Have your blood pressure checked once a year by your healthcare provider.  Normal blood pressure is 120/80  Weight- Have your body mass index (BMI) calculated to screen for obesity.  BMI is a measure of body fat based on height and weight. You can also calculate your own BMI at [email or web address].  Cholesterol- Have your cholesterol checked every year.  Diabetes- Have your blood sugar checked regularly if you have high blood pressure, high cholesterol, have a family history of diabetes or if you are overweight.  Screening for Colon Cancer- Colonoscopy starting at age 76.  Screening may begin sooner depending on your family history and other health conditions. Follow up colonoscopy as directed by your Gastroenterologist.  Screening for Prostate Cancer- Both blood work (PSA) and  a rectal exam help screen for Prostate Cancer.  Screening begins at age 70 with African-American men and at age 36 with Caucasian men.  Screening may begin sooner depending on your family history.  Take these medicines  Aspirin- One aspirin daily can help prevent Heart disease and Stroke.  Flu shot- Every fall.  Tetanus- Every 10 years.  Zostavax- Once after the age of 73 to prevent Shingles.  Pneumonia shot- Once after the age of 33; if you are younger than 32, ask your healthcare provider if you  need a Pneumonia shot.  Take these steps  Don't smoke- If you do smoke, talk to your doctor about quitting.  For tips on how to quit, go to www.smokefree.gov or call 1-800-QUIT-NOW.  Be physically active- Exercise 5 days a week for at least 30 minutes.  If you are not already physically active start slow and gradually work up to 30 minutes of moderate physical activity.  Examples of moderate activity include walking briskly, mowing the yard, dancing, swimming, bicycling, etc.  Eat a healthy diet- Eat a variety of healthy food such as fruits, vegetables, low fat milk, low fat cheese, yogurt, lean meant, poultry, fish, beans, tofu, etc. For more information go to www.thenutritionsource.org  Drink alcohol in moderation- Limit alcohol intake to less than two drinks a day. Never drink and drive.  Dentist- Brush and floss twice daily; visit your dentist twice a year.  Depression- Your emotional health is as important as your physical health. If you're feeling down, or losing interest in things you would normally enjoy please talk to your healthcare provider.  Eye exam- Visit your eye doctor every year.  Safe sex- If you may be exposed to a sexually transmitted infection, use a condom.  Seat belts- Seat belts can save your life; always wear one.  Smoke/Carbon Monoxide detectors- These detectors need to be installed on the appropriate level of your home.  Replace batteries at least once a year.  Skin cancer- When out in the sun, cover up and use sunscreen 15 SPF or higher.  Violence- If anyone is threatening you, please tell your healthcare provider.  Living Will/ Health care power of attorney- Speak with your healthcare provider and family.  Prediabetes Prediabetes is the condition of having a blood sugar (blood glucose) level that is higher than it should be, but not high enough for you to be diagnosed with type 2 diabetes. Having prediabetes puts you at risk for developing type 2 diabetes  (type 2 diabetes mellitus). Prediabetes may be called impaired glucose tolerance or impaired fasting glucose. Prediabetes usually does not cause symptoms. Your health care provider can diagnose this condition with blood tests. You may be tested for prediabetes if you are overweight and if you have at least one other risk factor for prediabetes. What is blood glucose, and how is it measured? Blood glucose refers to the amount of glucose in your bloodstream. Glucose comes from eating foods that contain sugars and starches (carbohydrates), which the body breaks down into glucose. Your blood glucose level may be measured in mg/dL (milligrams per deciliter) or mmol/L (millimoles per liter). Your blood glucose may be checked with one or more of the following blood tests:  A fasting blood glucose (FBG) test. You will not be allowed to eat (you will fast) for 8 hours or longer before a blood sample is taken. ? A normal range for FBG is 70-100 mg/dl (3.9-5.6 mmol/L).  An A1c (hemoglobin A1c) blood test. This test provides  information about blood glucose control over the previous 2?2months.  An oral glucose tolerance test (OGTT). This test measures your blood glucose at two times: ? After fasting. This is your baseline level. ? Two hours after you drink a beverage that contains glucose. You may be diagnosed with prediabetes:  If your FBG is 100?125 mg/dL (5.6-6.9 mmol/L).  If your A1c level is 5.7?6.4%.  If your OGTT result is 140?199 mg/dL (7.8-11 mmol/L). These blood tests may be repeated to confirm your diagnosis. How can this condition affect me? The pancreas produces a hormone (insulin) that helps to move glucose from the bloodstream into cells. When cells in the body do not respond properly to insulin that the body makes (insulin resistance), excess glucose builds up in the blood instead of going into cells. As a result, high blood glucose (hyperglycemia) can develop, which can cause many  complications. Hyperglycemia is a symptom of prediabetes. Having high blood glucose for a long time is dangerous. Too much glucose in your blood can damage your nerves and blood vessels. Long-term damage can lead to complications from diabetes, which may include:  Heart disease.  Stroke.  Blindness.  Kidney disease.  Depression.  Poor circulation in the feet and legs, which could lead to surgical removal (amputation) in severe cases. What can increase my risk? Risk factors for prediabetes include:  Having a family member with type 2 diabetes.  Being overweight or obese.  Being older than age 35.  Being of American [place], African-American, Hispanic/Latino, or Asian/Pacific Islander descent.  Having an inactive (sedentary) lifestyle.  Having a history of heart disease.  History of gestational diabetes or polycystic ovary syndrome (PCOS), in women.  Having low levels of good cholesterol (HDL-C) or high levels of blood fats (triglycerides).  Having high blood pressure. What actions can I take to prevent diabetes?      Be physically active. ? Do moderate-intensity physical activity for 30 or more minutes on 5 or more days of the week, or as much as told by your health care provider. This could be brisk walking, biking, or water aerobics. ? Ask your health care provider what activities are safe for you. A mix of physical activities may be best, such as walking, swimming, cycling, and strength training.  Lose weight as told by your health care provider. ? Losing 5-7% of your body weight can reverse insulin resistance. ? Your health care provider can determine how much weight loss is best for you and can help you lose weight safely.  Follow a healthy meal plan. This includes eating lean proteins, complex carbohydrates, fresh fruits and vegetables, low-fat dairy products, and healthy fats. ? Follow instructions from your health care provider about eating or drinking  restrictions. ? Make an appointment to see a diet and nutrition specialist (registered dietitian) to help you create a healthy eating plan that is right for you.  Do not smoke or use any tobacco products, such as cigarettes, chewing tobacco, and e-cigarettes. If you need help quitting, ask your health care provider.  Take over-the-counter and prescription medicines as told by your health care provider. You may be prescribed medicines that help lower the risk of type 2 diabetes.  Keep all follow-up visits as told by your health care provider. This is important. Summary  Prediabetes is the condition of having a blood sugar (blood glucose) level that is higher than it should be, but not high enough for you to be diagnosed with type 2 diabetes.  Having prediabetes  puts you at risk for developing type 2 diabetes (type 2 diabetes mellitus).  To help prevent type 2 diabetes, make lifestyle changes such as being physically active and eating a healthy diet. Lose weight as told by your health care provider. This information is not intended to replace advice given to you by your health care provider. Make sure you discuss any questions you have with your health care provider. Document Released: 08/06/2015 Document Revised: 08/06/2018 Document Reviewed: 06/05/2015 Elsevier Patient Education  [employer].     If you have lab work done today you will be contacted with your lab results within the next 2 weeks.  If you have not heard from [place] then please contact us. The fastest way to get your results is to register for My Chart.   IF you received an x-ray today, you will receive an invoice from [facility]. Please contact [facility] at [phone] with questions or concerns regarding your invoice.   IF you received labwork today, you will receive an invoice from [facility]. Please contact [facility] at [phone] with questions or concerns regarding your invoice.   Our billing  staff will not be able to assist you with questions regarding bills from these companies.  You will be contacted with the lab results as soon as they are available. The fastest way to get your results is to activate your My Chart account. Instructions are located on the last page of this paperwork. If you have not heard from [place] regarding the results in 2 weeks, please contact this office.       Signed,   [name], MD [facility].  02/14/19 2:50 PM

## 2019-02-15 LAB — LIPID PANEL
Chol/HDL Ratio: 4.4 ratio (ref 0.0–5.0)
Cholesterol, Total: 247 mg/dL — ABNORMAL HIGH (ref 100–199)
HDL: 56 mg/dL (ref >39)
LDL Chol Calc (NIH): 172 mg/dL — ABNORMAL HIGH (ref 0–99)
Triglycerides: 110 mg/dL (ref 0–149)
VLDL Cholesterol Cal: 19 mg/dL (ref 5–40)

## 2019-02-15 LAB — COMPREHENSIVE METABOLIC PANEL
ALT: 18 IU/L (ref 0–44)
AST: 26 IU/L (ref 0–40)
Albumin/Globulin Ratio: 1.7 (ref 1.2–2.2)
Albumin: 4.7 g/dL (ref 3.8–4.9)
Alkaline Phosphatase: 51 IU/L (ref 39–117)
BUN/Creatinine Ratio: 10 (ref 9–20)
BUN: 16 mg/dL (ref 6–24)
Bilirubin Total: 0.3 mg/dL (ref 0.0–1.2)
CO2: 23 mmol/L (ref 20–29)
Calcium: 9.6 mg/dL (ref 8.7–10.2)
Chloride: 101 mmol/L (ref 96–106)
Creatinine, Ser: 1.63 mg/dL — ABNORMAL HIGH (ref 0.76–1.27)
GFR calc Af Amer: 56 mL/min/{1.73_m2} — ABNORMAL LOW (ref >59)
GFR calc non Af Amer: 48 mL/min/{1.73_m2} — ABNORMAL LOW (ref >59)
Globulin, Total: 2.7 g/dL (ref 1.5–4.5)
Glucose: 100 mg/dL — ABNORMAL HIGH (ref 65–99)
Potassium: 4 mmol/L (ref 3.5–5.2)
Sodium: 138 mmol/L (ref 134–144)
Total Protein: 7.4 g/dL (ref 6.0–8.5)

## 2019-02-15 LAB — HEMOGLOBIN A1C
Est. average glucose Bld gHb Est-mCnc: 126 mg/dL
Hgb A1c MFr Bld: 6 % — ABNORMAL HIGH (ref 4.8–5.6)

## 2019-02-15 LAB — PSA: Prostate Specific Ag, Serum: 0.6 ng/mL (ref 0.0–4.0)

## 2019-04-18 ENCOUNTER — Other Ambulatory Visit: Payer: Self-pay

## 2019-04-18 ENCOUNTER — Ambulatory Visit (INDEPENDENT_AMBULATORY_CARE_PROVIDER_SITE_OTHER): Payer: BC Managed Care – PPO | Admitting: Family Medicine

## 2019-04-18 VITALS — Temp 98.1°F | Ht 73.0 in | Wt 198.6 lb

## 2019-04-18 DIAGNOSIS — Z7184 Encounter for health counseling related to travel: Secondary | ICD-10-CM | POA: Diagnosis not present

## 2019-04-18 DIAGNOSIS — R0789 Other chest pain: Secondary | ICD-10-CM

## 2019-04-18 DIAGNOSIS — Z298 Encounter for other specified prophylactic measures: Secondary | ICD-10-CM

## 2019-04-18 DIAGNOSIS — Z23 Encounter for immunization: Secondary | ICD-10-CM | POA: Diagnosis not present

## 2019-04-18 DIAGNOSIS — M791 Myalgia, unspecified site: Secondary | ICD-10-CM

## 2019-04-18 DIAGNOSIS — Z2989 Encounter for other specified prophylactic measures: Secondary | ICD-10-CM

## 2019-04-18 DIAGNOSIS — M545 Low back pain, unspecified: Secondary | ICD-10-CM

## 2019-04-18 DIAGNOSIS — X503XXA Overexertion from repetitive movements, initial encounter: Secondary | ICD-10-CM

## 2019-04-18 MED ORDER — DOXYCYCLINE HYCLATE 100 MG PO TABS: 100.0000 mg | ORAL_TABLET | Freq: Every day | ORAL | 0 refills | Status: DC

## 2019-04-18 MED ORDER — CYCLOBENZAPRINE HCL 5 MG PO TABS: ORAL_TABLET | ORAL | 0 refills | Status: DC

## 2019-04-18 MED ORDER — VIVOTIF PO CPDR: 1.0000 | DELAYED_RELEASE_CAPSULE | ORAL | 0 refills | Status: DC

## 2019-04-18 MED ORDER — DICLOFENAC SODIUM 75 MG PO TBEC: 75.0000 mg | DELAYED_RELEASE_TABLET | Freq: Two times a day (BID) | ORAL | 0 refills | Status: DC | PRN

## 2019-04-18 NOTE — Patient Instructions (Addendum)
Start typhoid prevention with vivotif today or tomorrow. Start doxycycline day of travel or night before.  Meningitis vaccine today.  covid test on 12/28 - results should be available by 12/30. To schedule, here is info: text "COVID" to [id number], OR  can log on to [email or web address] to easily make an on-line appointment. can call [phone] to get assistance.  Refill Flexeril and diclofenac for now but if the chest wall pain is not improving in the next week I do recommend repeat evaluation prior to leaving the country.  Return to the clinic or go to the nearest emergency room if any of your symptoms worsen or new symptoms occur.  Let me know if there are questions and stay safe  Chest Wall Pain Chest wall pain is pain in or around the bones and muscles of your chest. Sometimes, an injury causes this pain. Excessive coughing or overuse of arm and chest muscles may also cause chest wall pain. Sometimes, the cause may not be known. This pain may take several weeks or longer to get better. Follow these instructions at home: Managing pain, stiffness, and swelling   If directed, put ice on the painful area: ? Put ice in a plastic bag. ? Place a towel between your skin and the bag. ? Leave the ice on for 20 minutes, 2-3 times per day. Activity  Rest as told by your health care provider.  Avoid activities that cause pain. These include any activities that use your chest muscles or your abdominal and side muscles to lift heavy items. Ask your health care provider what activities are safe for you. General instructions   Take over-the-counter and prescription medicines only as told by your health care provider.  Do not use any products that contain nicotine or tobacco, such as cigarettes, e-cigarettes, and chewing tobacco. These can delay healing after injury. If you need help quitting, ask your health care provider.  Keep all follow-up visits as told by your health care provider. This is  important. Contact a health care provider if:  You have a fever.  Your chest pain becomes worse.  You have new symptoms. Get help right away if:  You have nausea or vomiting.  You feel sweaty or light-headed.  You have a cough with mucus from your lungs (sputum) or you cough up blood.  You develop shortness of breath. These symptoms may represent a serious problem that is an emergency. Do not wait to see if the symptoms will go away. Get medical help right away. Call your local emergency services (911 in the [place] Do not drive yourself to the hospital. Summary  Chest wall pain is pain in or around the bones and muscles of your chest.  Depending on the cause, it may be treated with ice, rest, medicines, and avoiding activities that cause pain.  Contact a health care provider if you have a fever, worsening chest pain, or new symptoms.  Get help right away if you feel light-headed or you develop shortness of breath. These symptoms may be an emergency. This information is not intended to replace advice given to you by your health care provider. Make sure you discuss any questions you have with your health care provider. Document Released: 04/14/2005 Document Revised: 10/15/2017 Document Reviewed: 10/15/2017 Elsevier Patient Education  [employer].   If you have lab work done today you will be contacted with your lab results within the next 2 weeks.  If you have not heard from [place] then please contact us.  The fastest way to get your results is to register for My Chart.   IF you received an x-ray today, you will receive an invoice from [facility]. Please contact [facility] at [phone] with questions or concerns regarding your invoice.   IF you received labwork today, you will receive an invoice from [facility]. Please contact [facility] at [phone] with questions or concerns regarding your invoice.   Our billing staff will not be able to assist you with  questions regarding bills from these companies.  You will be contacted with the lab results as soon as they are available. The fastest way to get your results is to activate your My Chart account. Instructions are located on the last page of this paperwork. If you have not heard from [place] regarding the results in 2 weeks, please contact this office.

## 2019-04-18 NOTE — Progress Notes (Signed)
Subjective:  Patient ID: Adam Cortez, male    DOB: Sep 15, 1965  Age: 53 y.o. MRN: [id number]  CC:  Chief Complaint  Patient presents with  . COVID test    Pt leaving out of town on 04/28/2019    HPI Bleu Reichel presents for   Leaving out of town on 12/31 for [place] for 2 months.  Visiting family.  Needs Covid test 5 days prior to travel.  No symptoms, no contact with Covid.   Malaria prophylaxis: will take doxyxyline.  Typhoid - vivotif rx in 2013.    Immunization History  Administered Date(s) Administered  . Hepatitis A 02/05/2010, 04/05/2011  . Hepatitis B 02/05/2010, 03/08/2010, 04/05/2011  . Influenza, Seasonal, Injecte, Preservative Fre 03/26/2012  . Influenza,inj,Quad PF,6+ Mos 02/22/2014, 02/09/2016, 01/06/2017, 02/14/2019  . Meningococcal Conjugate 03/26/2012  . Meningococcal Mcv4o 04/18/2019  . Meningococcal Polysaccharide 02/05/2010  . Td 10/27/2007  . Tdap 07/25/2017    Chest wall pain. R sided chest wall. Past week. Able to reproduce with pressing on area. No new injury. No change in pain to run.  Same sx's in past. No fever, no dyspnea, no rash. No hx of PUD.  Tx. voltaren - and flexeril - helps in past. Took few leftover pills. Out past few days - voltaren. CXR negative in 2019.    History Patient Active Problem List   Diagnosis Date Noted  . Chest discomfort 01/06/2017  . Atypical chest pain 01/06/2017  . Hyperlipidemia 01/25/2015  . Benign essential HTN 01/25/2015  . Erectile dysfunction 12/28/2014  . Anejaculation 12/28/2014  . 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 Allergy   . Hyperlipidemia   . Wears glasses    Past Surgical History:  Procedure Laterality Date  . LIPOMA EXCISION Right 06/03/2013   Procedure: EXCISION LIPOMA BACK;  Surgeon: Imogene Burn. Georgette Dover, MD;  Location: [facility];  Service: General;  Laterality: Right;  . NO PAST SURGERIES     No Known Allergies Prior to  Admission medications   Medication Sig Start Date End Date Taking? Authorizing Provider  cyclobenzaprine (FLEXERIL) 5 MG tablet Take 1 or 2 pills at bedtime as needed for muscle relaxant 01/09/18   Posey Boyer, MD  diclofenac (VOLTAREN) 75 MG EC tablet Take 1 tablet (75 mg total) by mouth 2 (two) times daily. 01/09/18   Posey Boyer, MD   Social History   Socioeconomic History  . Marital status: Married    Spouse name: [name]  . Number of children: 2  . Years of education: Not on file  . Highest education level: Not on file  Occupational History    Employer: [employer]  Tobacco Use  . Smoking status: Never Smoker  . Smokeless tobacco: Never Used  Substance and Sexual Activity  . Alcohol use: No  . Drug use: No  . Sexual activity: Yes  Other Topics Concern  . Not on file  Social History Narrative   From [place].   Came to the [place] in 1996.   Lives with his wife and their twins (fraternal), born in 2009.   Social Determinants of Health   Financial Resource Strain:   . Difficulty of Paying Living Expenses: Not on file  Food Insecurity:   . Worried About Charity fundraiser in the Last Year: Not on file  . Ran Out of Food in the Last Year: Not on file  Transportation Needs:   . Lack  of Transportation (Medical): Not on file  . Lack of Transportation (Non-Medical): Not on file  Physical Activity:   . Days of Exercise per Week: Not on file  . Minutes of Exercise per Session: Not on file  Stress:   . Feeling of Stress : Not on file  Social Connections:   . Frequency of Communication with Friends and Family: Not on file  . Frequency of Social Gatherings with Friends and Family: Not on file  . Attends Religious Services: Not on file  . Active Member of Clubs or Organizations: Not on file  . Attends Archivist Meetings: Not on file  . Marital Status: Not on file  Intimate Partner Violence:   . Fear of Current or Ex-Partner: Not on file  . Emotionally  Abused: Not on file  . Physically Abused: Not on file  . Sexually Abused: Not on file    Review of Systems Per HPI  Objective:   Vitals:   04/18/19 1530  Temp: 98.1 F (36.7 C)  TempSrc: Temporal  SpO2: 100%  Weight: 198 lb 9.6 oz (90.1 kg)  Height: 6\' 1"  (1.854 m)     Physical Exam Vitals reviewed.  Constitutional:      Appearance: He is well-developed.  HENT:     Head: Normocephalic and atraumatic.  Eyes:     Pupils: Pupils are equal, round, and reactive to light.  Neck:     Vascular: No carotid bruit or JVD.  Cardiovascular:     Rate and Rhythm: Normal rate and regular rhythm.     Heart sounds: Normal heart sounds. No murmur.  Pulmonary:     Effort: Pulmonary effort is normal. No respiratory distress.     Breath sounds: Normal breath sounds. No stridor. No wheezing or rales.  Chest:    Skin:    General: Skin is warm and dry.  Neurological:     Mental Status: He is alert and oriented to person, place, and time.        Assessment & Plan:  Joanathan Cortez is a 53 y.o. male . Travel advice encounter - Plan: Novel Coronavirus, NAA ([facility]) Need for malaria prophylaxis - Plan: doxycycline (VIBRA-TABS) 100 MG tablet Need for meningitis vaccination - Plan: CANCELED: Menveo Need for prophylactic vaccination with typhoid-paratyphoid (TAB) vaccine - Plan: typhoid (VIVOTIF) DR capsule  -Travel to [place].  Updated meningitis vaccine ordered, malaria prophylaxis ordered, typhoid vaccine with blood low-dose.  Discussed need to start that medication now, wait for doxycycline until that is finished.  Other anticipatory guidance regarding travel discussed.  -Plan for COVID-19 screening on December 28 which should allow enough time for results prior to his travel and within 5-day window.  Chest wall pain Myalgia - Plan: cyclobenzaprine (FLEXERIL) 5 MG tablet, diclofenac (VOLTAREN) 75 MG EC tablet Overuse syndrome - Plan: cyclobenzaprine (FLEXERIL) 5 MG tablet, diclofenac  (VOLTAREN) 75 MG EC tablet  -Right-sided chest wall pain, reproducible on exam.  Similar symptoms in the past.  Agreed to refill Voltaren, Flexeril temporarily for now but if not significantly improving prior to travel, repeat office visit possibly imaging recommended.  Sooner if worse.   Meds ordered this encounter  Medications  . doxycycline (VIBRA-TABS) 100 MG tablet    Sig: Take 1 tablet (100 mg total) by mouth daily.    Dispense:  90 tablet    Refill:  0  . typhoid (VIVOTIF) DR capsule    Sig: Take 1 capsule by mouth every other day.    Dispense:  4 capsule    Refill:  0  . cyclobenzaprine (FLEXERIL) 5 MG tablet    Sig: Take 1 or 2 pills at bedtime as needed for muscle relaxant    Dispense:  30 tablet    Refill:  0  . diclofenac (VOLTAREN) 75 MG EC tablet    Sig: Take 1 tablet (75 mg total) by mouth 2 (two) times daily as needed.    Dispense:  60 tablet    Refill:  0   Patient Instructions    Start typhoid prevention with vivotif today or tomorrow. Start doxycycline day of travel or night before.  Meningitis vaccine today.  covid test on 12/28 - results should be available by 12/30. To schedule, here is info: text "COVID" to [id number], OR  can log on to [email or web address] to easily make an on-line appointment. can call [phone] to get assistance.  Refill Flexeril and diclofenac for now but if the chest wall pain is not improving in the next week I do recommend repeat evaluation prior to leaving the country.  Return to the clinic or go to the nearest emergency room if any of your symptoms worsen or new symptoms occur.  Let me know if there are questions and stay safe  Chest Wall Pain Chest wall pain is pain in or around the bones and muscles of your chest. Sometimes, an injury causes this pain. Excessive coughing or overuse of arm and chest muscles may also cause chest wall pain. Sometimes, the cause may not be known. This pain may take several weeks or longer to get  better. Follow these instructions at home: Managing pain, stiffness, and swelling   If directed, put ice on the painful area: ? Put ice in a plastic bag. ? Place a towel between your skin and the bag. ? Leave the ice on for 20 minutes, 2-3 times per day. Activity  Rest as told by your health care provider.  Avoid activities that cause pain. These include any activities that use your chest muscles or your abdominal and side muscles to lift heavy items. Ask your health care provider what activities are safe for you. General instructions   Take over-the-counter and prescription medicines only as told by your health care provider.  Do not use any products that contain nicotine or tobacco, such as cigarettes, e-cigarettes, and chewing tobacco. These can delay healing after injury. If you need help quitting, ask your health care provider.  Keep all follow-up visits as told by your health care provider. This is important. Contact a health care provider if:  You have a fever.  Your chest pain becomes worse.  You have new symptoms. Get help right away if:  You have nausea or vomiting.  You feel sweaty or light-headed.  You have a cough with mucus from your lungs (sputum) or you cough up blood.  You develop shortness of breath. These symptoms may represent a serious problem that is an emergency. Do not wait to see if the symptoms will go away. Get medical help right away. Call your local emergency services (911 in the [place] Do not drive yourself to the hospital. Summary  Chest wall pain is pain in or around the bones and muscles of your chest.  Depending on the cause, it may be treated with ice, rest, medicines, and avoiding activities that cause pain.  Contact a health care provider if you have a fever, worsening chest pain, or new symptoms.  Get help right away if you feel light-headed or  you develop shortness of breath. These symptoms may be an emergency. This information is  not intended to replace advice given to you by your health care provider. Make sure you discuss any questions you have with your health care provider. Document Released: 04/14/2005 Document Revised: 10/15/2017 Document Reviewed: 10/15/2017 Elsevier Patient Education  [employer].   If you have lab work done today you will be contacted with your lab results within the next 2 weeks.  If you have not heard from [place] then please contact us. The fastest way to get your results is to register for My Chart.   IF you received an x-ray today, you will receive an invoice from [facility]. Please contact [facility] at [phone] with questions or concerns regarding your invoice.   IF you received labwork today, you will receive an invoice from [facility]. Please contact [facility] at [phone] with questions or concerns regarding your invoice.   Our billing staff will not be able to assist you with questions regarding bills from these companies.  You will be contacted with the lab results as soon as they are available. The fastest way to get your results is to activate your My Chart account. Instructions are located on the last page of this paperwork. If you have not heard from [place] regarding the results in 2 weeks, please contact this office.         Signed, [name], MD [facility]

## 2019-04-20 ENCOUNTER — Encounter: Payer: Self-pay | Admitting: Family Medicine

## 2019-04-25 ENCOUNTER — Ambulatory Visit: Payer: BC Managed Care – PPO | Attending: Internal Medicine

## 2019-04-25 DIAGNOSIS — Z20828 Contact with and (suspected) exposure to other viral communicable diseases: Secondary | ICD-10-CM | POA: Diagnosis not present

## 2019-04-25 DIAGNOSIS — Z20822 Contact with and (suspected) exposure to covid-19: Secondary | ICD-10-CM

## 2019-04-27 ENCOUNTER — Telehealth: Payer: Self-pay | Admitting: General Practice

## 2019-04-27 LAB — NOVEL CORONAVIRUS, NAA: SARS-CoV-2, NAA: NOT DETECTED

## 2019-04-27 NOTE — Telephone Encounter (Signed)
Negative COVID results given. Patient results "NOT Detected." Caller expressed understanding. ° °

## 2019-08-29 ENCOUNTER — Emergency Department (HOSPITAL_COMMUNITY)

## 2019-08-29 ENCOUNTER — Other Ambulatory Visit: Payer: Self-pay

## 2019-08-29 ENCOUNTER — Emergency Department (HOSPITAL_COMMUNITY)
Admission: EM | Admit: 2019-08-29 | Discharge: 2019-08-29 | Disposition: A | Discharge status: Home / Self Care | Attending: Emergency Medicine | Admitting: Emergency Medicine

## 2019-08-29 DIAGNOSIS — W19XXXA Unspecified fall, initial encounter: Secondary | ICD-10-CM

## 2019-08-29 DIAGNOSIS — S0101XA Laceration without foreign body of scalp, initial encounter: Secondary | ICD-10-CM

## 2019-08-29 DIAGNOSIS — Y999 Unspecified external cause status: Secondary | ICD-10-CM | POA: Diagnosis not present

## 2019-08-29 DIAGNOSIS — Y929 Unspecified place or not applicable: Secondary | ICD-10-CM | POA: Insufficient documentation

## 2019-08-29 DIAGNOSIS — W11XXXA Fall on and from ladder, initial encounter: Secondary | ICD-10-CM | POA: Diagnosis not present

## 2019-08-29 DIAGNOSIS — S3991XA Unspecified injury of abdomen, initial encounter: Secondary | ICD-10-CM | POA: Diagnosis not present

## 2019-08-29 DIAGNOSIS — S79922A Unspecified injury of left thigh, initial encounter: Secondary | ICD-10-CM | POA: Diagnosis not present

## 2019-08-29 DIAGNOSIS — R9431 Abnormal electrocardiogram [ECG] [EKG]: Secondary | ICD-10-CM | POA: Diagnosis not present

## 2019-08-29 DIAGNOSIS — S199XXA Unspecified injury of neck, initial encounter: Secondary | ICD-10-CM | POA: Diagnosis not present

## 2019-08-29 DIAGNOSIS — S299XXA Unspecified injury of thorax, initial encounter: Secondary | ICD-10-CM | POA: Diagnosis not present

## 2019-08-29 DIAGNOSIS — S0003XA Contusion of scalp, initial encounter: Secondary | ICD-10-CM | POA: Diagnosis not present

## 2019-08-29 DIAGNOSIS — Y939 Activity, unspecified: Secondary | ICD-10-CM | POA: Diagnosis not present

## 2019-08-29 LAB — LACTIC ACID, PLASMA: Lactic Acid, Venous: 1.2 mmol/L (ref 0.5–1.9)

## 2019-08-29 LAB — I-STAT CHEM 8, ED
BUN: 24 mg/dL — ABNORMAL HIGH (ref 6–20)
Calcium, Ion: 1.18 mmol/L (ref 1.15–1.40)
Chloride: 102 mmol/L (ref 98–111)
Creatinine, Ser: 1.6 mg/dL — ABNORMAL HIGH (ref 0.61–1.24)
Glucose, Bld: 134 mg/dL — ABNORMAL HIGH (ref 70–99)
HCT: 41 % (ref 39.0–52.0)
Hemoglobin: 13.9 g/dL (ref 13.0–17.0)
Potassium: 3.8 mmol/L (ref 3.5–5.1)
Sodium: 139 mmol/L (ref 135–145)
TCO2: 27 mmol/L (ref 22–32)

## 2019-08-29 LAB — COMPREHENSIVE METABOLIC PANEL
ALT: 20 U/L (ref 0–44)
AST: 27 U/L (ref 15–41)
Albumin: 4 g/dL (ref 3.5–5.0)
Alkaline Phosphatase: 44 U/L (ref 38–126)
Anion gap: 12 (ref 5–15)
BUN: 22 mg/dL — ABNORMAL HIGH (ref 6–20)
CO2: 23 mmol/L (ref 22–32)
Calcium: 9.1 mg/dL (ref 8.9–10.3)
Chloride: 102 mmol/L (ref 98–111)
Creatinine, Ser: 1.73 mg/dL — ABNORMAL HIGH (ref 0.61–1.24)
GFR calc Af Amer: 52 mL/min — ABNORMAL LOW (ref >60)
GFR calc non Af Amer: 45 mL/min — ABNORMAL LOW (ref >60)
Glucose, Bld: 138 mg/dL — ABNORMAL HIGH (ref 70–99)
Potassium: 3.9 mmol/L (ref 3.5–5.1)
Sodium: 137 mmol/L (ref 135–145)
Total Bilirubin: 0.8 mg/dL (ref 0.3–1.2)
Total Protein: 7.2 g/dL (ref 6.5–8.1)

## 2019-08-29 LAB — CBC
HCT: 40.6 % (ref 39.0–52.0)
Hemoglobin: 14.1 g/dL (ref 13.0–17.0)
MCH: 30.3 pg (ref 26.0–34.0)
MCHC: 34.7 g/dL (ref 30.0–36.0)
MCV: 87.3 fL (ref 80.0–100.0)
Platelets: 191 10*3/uL (ref 150–400)
RBC: 4.65 MIL/uL (ref 4.22–5.81)
RDW: 11.9 % (ref 11.5–15.5)
WBC: 6.3 10*3/uL (ref 4.0–10.5)
nRBC: 0 % (ref 0.0–0.2)

## 2019-08-29 LAB — CK: Total CK: 479 U/L — ABNORMAL HIGH (ref 49–397)

## 2019-08-29 LAB — URINALYSIS, ROUTINE W REFLEX MICROSCOPIC
Bacteria, UA: NONE SEEN
Bilirubin Urine: NEGATIVE
Glucose, UA: NEGATIVE mg/dL
Ketones, ur: NEGATIVE mg/dL
Leukocytes,Ua: NEGATIVE
Nitrite: NEGATIVE
Protein, ur: NEGATIVE mg/dL
Specific Gravity, Urine: >1.046 — ABNORMAL HIGH (ref 1.005–1.030)
pH: 6 (ref 5.0–8.0)

## 2019-08-29 LAB — ETHANOL: Alcohol, Ethyl (B): <10 mg/dL (ref <10)

## 2019-08-29 LAB — SAMPLE TO BLOOD BANK

## 2019-08-29 MED ORDER — MIDAZOLAM HCL 5 MG/5ML IJ SOLN
INTRAMUSCULAR | Status: AC | PRN
  Administered 2019-08-29: 2 mg via INTRAVENOUS

## 2019-08-29 MED ORDER — LIDOCAINE HCL (PF) 1 % IJ SOLN
10.0000 mL | Freq: Once | INTRAMUSCULAR | Status: AC
  Administered 2019-08-29: 10 mL
  Filled 2019-08-29: qty 10

## 2019-08-29 MED ORDER — FENTANYL CITRATE (PF) 100 MCG/2ML IJ SOLN
INTRAMUSCULAR | Status: AC | PRN
  Administered 2019-08-29: 50 ug via INTRAVENOUS

## 2019-08-29 MED ORDER — FENTANYL CITRATE (PF) 100 MCG/2ML IJ SOLN
INTRAMUSCULAR | Status: AC
  Filled 2019-08-29: qty 2

## 2019-08-29 MED ORDER — MIDAZOLAM HCL 2 MG/2ML IJ SOLN
INTRAMUSCULAR | Status: AC
  Filled 2019-08-29: qty 2

## 2019-08-29 MED ORDER — SODIUM CHLORIDE 0.9 % IV BOLUS
1000.0000 mL | Freq: Once | INTRAVENOUS | Status: AC
  Administered 2019-08-29: 15:00:00 1000 mL via INTRAVENOUS

## 2019-08-29 MED ORDER — IOHEXOL 300 MG/ML  SOLN
100.0000 mL | Freq: Once | INTRAMUSCULAR | Status: AC | PRN
  Administered 2019-08-29: 100 mL via INTRAVENOUS

## 2019-08-29 NOTE — ED Notes (Signed)
Wife at bedside.

## 2019-08-29 NOTE — Progress Notes (Signed)
Orthopedic Tech Progress Note Patient Details:  Adam Cortez 1966-01-29 [id number] Level 2 trauma Patient ID: Rowe Clack, male   DOB: 1965-11-14, 54 y.o.   MRN: [id number]   Janit Pagan 08/29/2019, 1:43 PM

## 2019-08-29 NOTE — ED Provider Notes (Signed)
I received the patient in signout from Dr. Roslynn Amble.  Briefly the patient is a 54 year old male with some bizarre behavior after falling about 8 feet off a ladder.  CT scan of the head through the pelvis negative for acute traumatic pathology.  Slow improvement.  Thought to be may be secondary to ingestion.  Patient much more alert but complaining of some leg pain.  Plan for plain film of the femur.  Plain film viewed by me negative.  Discharge home.   EKG Interpretation  Date/Time:  Monday Aug 29 2019 17:05:00 EDT Ventricular Rate:  86 PR Interval:    QRS Duration: 82 QT Interval:  322 QTC Calculation: 386 R Axis:   96 Text Interpretation: Sinus rhythm Borderline right axis deviation Borderline T abnormalities, inferior leads No old tracing to compare Confirmed by Deno Etienne [phone]) on 08/29/2019 5:21:04 PM         Deno Etienne, DO 08/29/19 1722

## 2019-08-29 NOTE — ED Triage Notes (Signed)
Pt arrives via EMS from work with reports of falling 8 ft off a ladder onto concrete. EMS reports pt too combative to obtain vitals and unequal pupils. Pt speaking another language.

## 2019-08-29 NOTE — Discharge Instructions (Signed)
Please have the have your sutures and staples removed in 5 to 7 days.  Recommend recheck with primary doctor in the next 24 to 48 hours.

## 2019-08-29 NOTE — ED Provider Notes (Signed)
[facility] EMERGENCY DEPARTMENT Provider Note   CSN: [id number] Arrival date & time: 08/29/19  1308     History Chief Complaint  Patient presents with  . Fall    Adam Cortez is a 54 y.o. male.   Level 5 caveat history limited due to altered mental status, acuity.  EMS reported patient fell at work 10 to 12 feet striking head, noted to be confused, combative, possible unequal pupils, bleeding noted from head, uncooperative, given 5 mg IM Versed and restrained to take to ER.  Additional history obtained via wife later, patient was in his normal state of health, does not have psych history, currently on Ramadan, may be fasting, speaks many languages including [place], dialect of language from [place], [place].  HPI     No past medical history on file.  There are no problems to display for this patient.   No family history on file.  Social History   Tobacco Use  . Smoking status: Not on file  Substance Use Topics  . Alcohol use: Not on file  . Drug use: Not on file    Home Medications Prior to Admission medications   Not on File    Allergies    Patient has no known allergies.  Review of Systems   Review of Systems  Physical Exam Updated Vital Signs BP (!) 151/85   Pulse (!) 106   Temp (!) 95.3 F (35.2 C) (Temporal)   Resp 16   Ht 6\' 1"  (1.854 m)   Wt 90.7 kg   SpO2 100%   BMI 26.39 kg/m   Physical Exam Vitals and nursing note reviewed.  Constitutional:      Appearance: He is well-developed.     Comments: Combative, confused swinging arms, legs somewhat violently unless restrained, repeating words in foreign language  HENT:     Head:     Comments: Somewhat jagged total 4cm laceration over posterior occiput with generalized swelling, small bleeding noted, no arterial Eyes:     Conjunctiva/sclera: Conjunctivae normal.  Cardiovascular:     Rate and Rhythm: Normal rate and regular rhythm.     Heart sounds: No murmur.    Pulmonary:     Effort: Pulmonary effort is normal. No respiratory distress.     Breath sounds: Normal breath sounds.  Abdominal:     Palpations: Abdomen is soft.     Tenderness: There is no abdominal tenderness.  Musculoskeletal:     Cervical back: Neck supple.     Comments: Back: no C, T, L spine TTP, no step off or deformity RUE: no TTP throughout, no deformity, normal joint ROM, radial pulse intact, distal sensation and motor intact LUE: no TTP throughout, no deformity, normal joint ROM, radial pulse intact, distal sensation and motor intact RLE:  no TTP throughout, no deformity, normal joint ROM, distal pulse, sensation and motor intact LLE: Tenderness to palpation throughout left femur, no deformity, normal joint ROM, distal pulse, sensation and motor intact  Skin:    General: Skin is warm and dry.     Capillary Refill: Capillary refill takes less than 2 seconds.  Neurological:     Comments: Alert, opens eyes to voice, moves all four extremities purposefully but not following commands, confused verbal response GCS 12-13  Psychiatric:     Comments: Combative, swinging arms, legs somewhat violently unless restrained, repeating words in foreign language     ED Results / Procedures / Treatments   Labs (all labs ordered are listed,  but only abnormal results are displayed) Labs Reviewed  COMPREHENSIVE METABOLIC PANEL - Abnormal; Notable for the following components:      Result Value   Glucose, Bld 138 (*)    BUN 22 (*)    Creatinine, Ser 1.73 (*)    GFR calc non Af Amer 45 (*)    GFR calc Af Amer 52 (*)    All other components within normal limits  URINALYSIS, ROUTINE W REFLEX MICROSCOPIC - Abnormal; Notable for the following components:   Color, Urine STRAW (*)    Specific Gravity, Urine >1.046 (*)    Hgb urine dipstick SMALL (*)    All other components within normal limits  CK - Abnormal; Notable for the following components:   Total CK 479 (*)    All other components  within normal limits  I-STAT CHEM 8, ED - Abnormal; Notable for the following components:   BUN 24 (*)    Creatinine, Ser 1.60 (*)    Glucose, Bld 134 (*)    All other components within normal limits  CBC  ETHANOL  LACTIC ACID, PLASMA  SAMPLE TO BLOOD BANK    EKG EKG Interpretation  Date/Time:  Monday Aug 29 2019 17:05:00 EDT Ventricular Rate:  86 PR Interval:    QRS Duration: 82 QT Interval:  322 QTC Calculation: 386 R Axis:   96 Text Interpretation: Sinus rhythm Borderline right axis deviation Borderline T abnormalities, inferior leads No old tracing to compare Confirmed by Deno Etienne [phone]) on 08/29/2019 5:21:04 PM Also confirmed by Deno Etienne [phone]), editor Jeanine Luz [phone])  on 08/30/2019 7:03:20 AM   Radiology CT HEAD WO CONTRAST  Result Date: 08/29/2019 CLINICAL DATA:  Trauma, fall from ladder EXAM: CT HEAD WITHOUT CONTRAST CT CERVICAL SPINE WITHOUT CONTRAST TECHNIQUE: Multidetector CT imaging of the head and cervical spine was performed following the standard protocol without intravenous contrast. Multiplanar CT image reconstructions of the cervical spine were also generated. COMPARISON:  None. FINDINGS: CT HEAD FINDINGS Brain: No evidence of acute infarction, hemorrhage, hydrocephalus, extra-axial collection or mass lesion/mass effect. Vascular: No hyperdense vessel or unexpected calcification. Skull: Normal. Negative for fracture or focal lesion. Sinuses/Orbits: No acute finding. Other: Soft tissue laceration and contusion of the left parietal scalp. CT CERVICAL SPINE FINDINGS Alignment: Normal. Skull base and vertebrae: No acute fracture. No primary bone lesion or focal pathologic process. Soft tissues and spinal canal: No prevertebral fluid or swelling. No visible canal hematoma. Disc levels:  Intact. Upper chest: Negative. Other: None. IMPRESSION: 1.  No acute intracranial pathology. 2.  Soft tissue laceration and contusion of the left parietal scalp. 3.  No fracture or  static subluxation of the cervical spine. Electronically Signed   By: Eddie Candle M.D.   On: 08/29/2019 14:00   CT Chest W Contrast  Result Date: 08/29/2019 CLINICAL DATA:  Fall from ladder EXAM: CT CHEST, ABDOMEN, AND PELVIS WITH CONTRAST TECHNIQUE: Multidetector CT imaging of the chest, abdomen and pelvis was performed following the standard protocol during bolus administration of intravenous contrast. CONTRAST:  125mL OMNIPAQUE IOHEXOL 300 MG/ML  SOLN COMPARISON:  None. FINDINGS: CT CHEST FINDINGS Cardiovascular: No significant vascular findings. Normal heart size. No pericardial effusion. Mediastinum/Nodes: No enlarged mediastinal, hilar, or axillary lymph nodes. Thyroid gland, trachea, and esophagus demonstrate no significant findings. Lungs/Pleura: Lungs are clear. No pleural effusion or pneumothorax. Musculoskeletal: No chest wall mass or suspicious bone lesions identified. CT ABDOMEN PELVIS FINDINGS Hepatobiliary: No solid liver abnormality is seen. No gallstones, gallbladder wall thickening,  or biliary dilatation. Pancreas: Unremarkable. No pancreatic ductal dilatation or surrounding inflammatory changes. Spleen: Normal in size without significant abnormality. Adrenals/Urinary Tract: Adrenal glands are unremarkable. Kidneys are normal, without renal calculi, solid lesion, or hydronephrosis. Bladder is unremarkable. Stomach/Bowel: Stomach is within normal limits. Appendix appears normal. No evidence of bowel wall thickening, distention, or inflammatory changes. Vascular/Lymphatic: No significant vascular findings are present. No enlarged abdominal or pelvic lymph nodes. Reproductive: No mass or other abnormality. Other: No abdominal wall hernia or abnormality. No abdominopelvic ascites. Musculoskeletal: No acute or significant osseous findings. IMPRESSION: No evidence of acute traumatic injury to the chest, abdomen, or pelvis. Electronically Signed   By: Eddie Candle M.D.   On: 08/29/2019 14:05   CT  Cervical Spine Wo Contrast  Result Date: 08/29/2019 CLINICAL DATA:  Trauma, fall from ladder EXAM: CT HEAD WITHOUT CONTRAST CT CERVICAL SPINE WITHOUT CONTRAST TECHNIQUE: Multidetector CT imaging of the head and cervical spine was performed following the standard protocol without intravenous contrast. Multiplanar CT image reconstructions of the cervical spine were also generated. COMPARISON:  None. FINDINGS: CT HEAD FINDINGS Brain: No evidence of acute infarction, hemorrhage, hydrocephalus, extra-axial collection or mass lesion/mass effect. Vascular: No hyperdense vessel or unexpected calcification. Skull: Normal. Negative for fracture or focal lesion. Sinuses/Orbits: No acute finding. Other: Soft tissue laceration and contusion of the left parietal scalp. CT CERVICAL SPINE FINDINGS Alignment: Normal. Skull base and vertebrae: No acute fracture. No primary bone lesion or focal pathologic process. Soft tissues and spinal canal: No prevertebral fluid or swelling. No visible canal hematoma. Disc levels:  Intact. Upper chest: Negative. Other: None. IMPRESSION: 1.  No acute intracranial pathology. 2.  Soft tissue laceration and contusion of the left parietal scalp. 3.  No fracture or static subluxation of the cervical spine. Electronically Signed   By: Eddie Candle M.D.   On: 08/29/2019 14:00   CT ABDOMEN PELVIS W CONTRAST  Result Date: 08/29/2019 CLINICAL DATA:  Fall from ladder EXAM: CT CHEST, ABDOMEN, AND PELVIS WITH CONTRAST TECHNIQUE: Multidetector CT imaging of the chest, abdomen and pelvis was performed following the standard protocol during bolus administration of intravenous contrast. CONTRAST:  13mL OMNIPAQUE IOHEXOL 300 MG/ML  SOLN COMPARISON:  None. FINDINGS: CT CHEST FINDINGS Cardiovascular: No significant vascular findings. Normal heart size. No pericardial effusion. Mediastinum/Nodes: No enlarged mediastinal, hilar, or axillary lymph nodes. Thyroid gland, trachea, and esophagus demonstrate no  significant findings. Lungs/Pleura: Lungs are clear. No pleural effusion or pneumothorax. Musculoskeletal: No chest wall mass or suspicious bone lesions identified. CT ABDOMEN PELVIS FINDINGS Hepatobiliary: No solid liver abnormality is seen. No gallstones, gallbladder wall thickening, or biliary dilatation. Pancreas: Unremarkable. No pancreatic ductal dilatation or surrounding inflammatory changes. Spleen: Normal in size without significant abnormality. Adrenals/Urinary Tract: Adrenal glands are unremarkable. Kidneys are normal, without renal calculi, solid lesion, or hydronephrosis. Bladder is unremarkable. Stomach/Bowel: Stomach is within normal limits. Appendix appears normal. No evidence of bowel wall thickening, distention, or inflammatory changes. Vascular/Lymphatic: No significant vascular findings are present. No enlarged abdominal or pelvic lymph nodes. Reproductive: No mass or other abnormality. Other: No abdominal wall hernia or abnormality. No abdominopelvic ascites. Musculoskeletal: No acute or significant osseous findings. IMPRESSION: No evidence of acute traumatic injury to the chest, abdomen, or pelvis. Electronically Signed   By: Eddie Candle M.D.   On: 08/29/2019 14:05   DG Femur Min 2 Views Left  Result Date: 08/29/2019 CLINICAL DATA:  [place] off ladder EXAM: LEFT FEMUR 2 VIEWS COMPARISON:  None. FINDINGS: Excreted contrast in  the urinary bladder. No fracture or malalignment. IMPRESSION: Negative. Electronically Signed   By: Donavan Foil M.D.   On: 08/29/2019 17:00    Procedures .Critical Care Performed by: Lucrezia Starch, MD Authorized by: Lucrezia Starch, MD   Critical care provider statement:    Critical care time (minutes):  38   Critical care was necessary to treat or prevent imminent or life-threatening deterioration of the following conditions:  Trauma   Critical care was time spent personally by me on the following activities:  Discussions with consultants, evaluation  of patient's response to treatment, examination of patient, ordering and performing treatments and interventions, ordering and review of laboratory studies, ordering and review of radiographic studies, pulse oximetry, re-evaluation of patient's condition, obtaining history from patient or surrogate and review of old charts .Marland KitchenLaceration Repair  Date/Time: 08/30/2019 9:01 AM Performed by: Lucrezia Starch, MD Authorized by: Lucrezia Starch, MD   Consent:    Consent obtained:  Verbal   Consent given by:  Patient and spouse   Risks discussed:  Infection, pain, nerve damage and poor wound healing   Alternatives discussed:  No treatment Anesthesia (see MAR for exact dosages):    Anesthesia method:  Local infiltration   Local anesthetic:  Lidocaine 1% w/o epi Laceration details:    Location:  Scalp   Scalp location:  L parietal   Length (cm):  4 Repair type:    Repair type:  Simple Treatment:    Area cleansed with:  Saline   Amount of cleaning:  Extensive   Irrigation solution:  Sterile saline   Irrigation method:  Syringe   Visualized foreign bodies/material removed: no   Skin repair:    Repair method:  Sutures and staples   Suture size:  5-0   Suture material:  Nylon   Number of sutures:  3   Number of staples:  3 Approximation:    Approximation:  Close Post-procedure details:    Patient tolerance of procedure:  Tolerated well, no immediate complications   (including critical care time)  Medications Ordered in ED Medications  midazolam (VERSED) 5 MG/5ML injection ( Intravenous Canceled Entry 08/29/19 1330)  midazolam (VERSED) 5 MG/5ML injection (2 mg Intravenous Given 08/29/19 1318)  iohexol (OMNIPAQUE) 300 MG/ML solution 100 mL (100 mLs Intravenous Contrast Given 08/29/19 1352)  fentaNYL (SUBLIMAZE) injection ( Intravenous Canceled Entry 08/29/19 1400)  sodium chloride 0.9 % bolus 1,000 mL (0 mLs Intravenous Stopped 08/29/19 1745)  lidocaine (PF) (XYLOCAINE) 1 % injection 10 mL  (10 mLs Infiltration Given 08/29/19 1500)    ED Course  I have reviewed the triage vital signs and the nursing notes.  Pertinent labs & imaging results that were available during my care of the patient were reviewed by me and considered in my medical decision making (see chart for details).  Clinical Course as of Aug 29 901  Mon Aug 29, 2019  1341 At bedside on arri   [RD]    Clinical Course User Index [RD] Lucrezia Starch, MD   MDM Rules/Calculators/A&P                      54 year old male presented to ER as a level 2 trauma.  Initial report 10 to 12 feet, fairly significant hematoma, laceration to posterior occiput.  Patient was very combative, appeared acutely psychotic.  EMS had reported unequal pupils however they were equal on my close inspection.  Given his significant mental status change, obvious head trauma,  titrated additional doses of Versed so that patient was calm enough to obtain CT imaging.  Although I did not notice obvious trauma to remainder of body initial exam, given the significant mechanism as well as patient's uncooperativeness with exam, proceeded with completion CT imaging.  Thankfully, all trauma CTs were negative.  After patient was observed in ER, brother and wife came to bedside, patient slowly improved and had resolution of his AMS.  He was much more calm, speaking English without any difficulty as per normal.  Suspect this traumatic event may have triggered brief episode of acute psychosis.  Given that patient has had return to baseline, no additional injuries, believe he can be discharged home.  Performed laceration repair, fairly jagged laceration laceration, close approximation.  Utilized both staples and a couple sutures.  Instructed on wound care with patient and wife.  Recommended PCP recheck and suture removal.  Repeated secondary exam once patient was more awake, reported some tenderness to his left femur though no deformity was noted, x-ray ordered and  pending at time of signout.   Final Clinical Impression(s) / ED Diagnoses Final diagnoses:  Fall, initial encounter  Laceration of scalp, initial encounter    Rx / DC Orders ED Discharge Orders    None       Lucrezia Starch, MD 08/30/19 [id number]

## 2019-09-21 ENCOUNTER — Encounter: Payer: Self-pay | Admitting: Family Medicine

## 2019-09-21 ENCOUNTER — Ambulatory Visit (INDEPENDENT_AMBULATORY_CARE_PROVIDER_SITE_OTHER): Payer: BC Managed Care – PPO | Admitting: Family Medicine

## 2019-09-21 ENCOUNTER — Other Ambulatory Visit: Payer: Self-pay

## 2019-09-21 DIAGNOSIS — R110 Nausea: Secondary | ICD-10-CM | POA: Diagnosis not present

## 2019-09-21 DIAGNOSIS — R29818 Other symptoms and signs involving the nervous system: Secondary | ICD-10-CM

## 2019-09-21 DIAGNOSIS — G44309 Post-traumatic headache, unspecified, not intractable: Secondary | ICD-10-CM | POA: Diagnosis not present

## 2019-09-21 DIAGNOSIS — R200 Anesthesia of skin: Secondary | ICD-10-CM | POA: Diagnosis not present
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
## 2019-09-21 NOTE — Patient Instructions (Addendum)
   With increasing headaches, nausea, balance issues and leg numbness I do recommend repeat CT scan of the head, as well as meeting with neurology.  I have placed both of those orders, but would talk to your Worker's Comp provider as well regarding these symptoms.  Return to the clinic or go to the nearest emergency room if any of your symptoms worsen or new symptoms occur.     If you have lab work done today you will be contacted with your lab results within the next 2 weeks.  If you have not heard from [place] then please contact us. The fastest way to get your results is to register for My Chart.   IF you received an x-ray today, you will receive an invoice from [facility]. Please contact [facility]iology at [phone] with questions or concerns regarding your invoice.   IF you received labwork today, you will receive an invoice from [place]. Please contact [facility] at [phone] with questions or concerns regarding your invoice.   Our billing staff will not be able to assist you with questions regarding bills from these companies.  You will be contacted with the lab results as soon as they are available. The fastest way to get your results is to activate your My Chart account. Instructions are located on the last page of this paperwork. If you have not heard from [place] regarding the results in 2 weeks, please contact this office.

## 2019-10-04 ENCOUNTER — Telehealth: Payer: Self-pay | Admitting: Family Medicine

## 2019-10-04 NOTE — Telephone Encounter (Signed)
Message received from [facility] neurology, unable to see patient through [employer]  Please call patient and check status.  Has he discussde his symptoms with Worker's Comp. provider and did they order any referrals or further testing?

## 2019-10-05 ENCOUNTER — Other Ambulatory Visit: Payer: BC Managed Care – PPO

## 2019-10-05 NOTE — Telephone Encounter (Signed)
Called pt and LM for him to call back about workers comp.

## 2020-04-02 ENCOUNTER — Other Ambulatory Visit: Payer: Self-pay

## 2020-04-02 ENCOUNTER — Ambulatory Visit (INDEPENDENT_AMBULATORY_CARE_PROVIDER_SITE_OTHER): Payer: BC Managed Care – PPO | Admitting: Family Medicine

## 2020-04-02 ENCOUNTER — Encounter: Payer: Self-pay | Admitting: Family Medicine

## 2020-04-02 VITALS — BP 140/90 | HR 81 | Temp 97.5°F | Ht 73.0 in | Wt 220.0 lb

## 2020-04-02 DIAGNOSIS — Z23 Encounter for immunization: Secondary | ICD-10-CM | POA: Diagnosis not present

## 2020-04-02 DIAGNOSIS — M7989 Other specified soft tissue disorders: Secondary | ICD-10-CM | POA: Diagnosis not present

## 2020-04-02 DIAGNOSIS — M79661 Pain in right lower leg: Secondary | ICD-10-CM

## 2020-04-02 NOTE — Progress Notes (Signed)
Subjective:  Patient ID: Adam Cortez, male    DOB: 1965-10-05  Age: 54 y.o. MRN: [id number]  CC:  Chief Complaint  Patient presents with  . possible DVT    In R leg. PT has a notef from [facility]  stating he needs is R Calf examined to rule out a DVT.Pt saw them on 03/30/2020.  Pt  reports pain on the antiror side of his calf muscel. vein visable in the muscel where the pt reportss his pain. pt has been waring compression socks for a few days now.   . Referral    pt is request a referral to neuro.    HPI Adam Cortez presents for   Right calf/leg pain: Reportedly was seen by another provider on December 3 ( [facility]) with concern for possible DVT, painful vein on his calf muscle.  Has been treating with compression socks for the past 3 months,  Has had pain in R calf since May 3rd, but also radiating pain form back to posterior thigh, medial calf and under foot.  Has been followed by Dr. Tonita Cong with ortho for back and leg pain. Worker's comp. Plan for neuro eval, not sure if he has been referred. Had PT, no injections.  More pain in calf past few months. Feels like swollen at times.  No chest pain/dyspena.  No hx of DVT/PE.  No recent prolonged car travel or air travel, no recent surgery. Planning on flying to [place] soon, father passed away last week.    History Patient Active Problem List   Diagnosis Date Noted  . Chest discomfort 01/06/2017  . Atypical chest pain 01/06/2017  . Hyperlipidemia 01/25/2015  . Benign essential HTN 01/25/2015  . Erectile dysfunction 12/28/2014  . Anejaculation 12/28/2014  . 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 Allergy   . Hyperlipidemia   . Wears glasses    Past Surgical History:  Procedure Laterality Date  . LIPOMA EXCISION Right 06/03/2013   Procedure: EXCISION LIPOMA BACK;  Surgeon: Imogene Burn. Georgette Dover, MD;  Location: [place];  Service: General;  Laterality: Right;  . NO  PAST SURGERIES     No Known Allergies Prior to Admission medications   Medication Sig Start Date End Date Taking? Authorizing Provider  ibuprofen (ADVIL) 800 MG tablet Take 800 mg by mouth 3 (three) times daily. With food as needed   Yes [provider]  nabumetone (RELAFEN) 750 MG tablet Take 750 mg by mouth 2 (two) times daily.   Yes [provider]  tiZANidine (ZANAFLEX) 4 MG tablet Take 4 mg by mouth 3 (three) times daily.   Yes [provider]  traMADol (ULTRAM) 50 MG tablet Take 50 mg by mouth 2 (two) times daily.   Yes [provider]   Social History   Socioeconomic History  . Marital status: Married    Spouse name: [name]  . Number of children: 2  . Years of education: Not on file  . Highest education level: Not on file  Occupational History    Employer: [employer]  Tobacco Use  . Smoking status: Never Smoker  . Smokeless tobacco: Never Used  Vaping Use  . Vaping Use: Never used  Substance and Sexual Activity  . Alcohol use: No  . Drug use: No  . Sexual activity: Yes  Other Topics Concern  . Not on file  Social History Narrative   From [place].   Came  to the [place] in 1996.   Lives with his wife and their twins (fraternal), born in 2009.   Social Determinants of Health   Financial Resource Strain:   . Difficulty of Paying Living Expenses: Not on file  Food Insecurity:   . Worried About Charity fundraiser in the Last Year: Not on file  . Ran Out of Food in the Last Year: Not on file  Transportation Needs:   . Lack of Transportation (Medical): Not on file  . Lack of Transportation (Non-Medical): Not on file  Physical Activity:   . Days of Exercise per Week: Not on file  . Minutes of Exercise per Session: Not on file  Stress:   . Feeling of Stress : Not on file  Social Connections:   . Frequency of Communication with Friends and Family: Not on file  . Frequency of Social Gatherings with Friends and Family: Not on file   . Attends Religious Services: Not on file  . Active Member of Clubs or Organizations: Not on file  . Attends Archivist Meetings: Not on file  . Marital Status: Not on file  Intimate Partner Violence:   . Fear of Current or Ex-Partner: Not on file  . Emotionally Abused: Not on file  . Physically Abused: Not on file  . Sexually Abused: Not on file    Review of Systems Per HPI.   Objective:   Vitals:   04/02/20 0912 04/02/20 0930  BP: (!) 143/88 140/90  Pulse: 81   Temp: (!) 97.5 F (36.4 C)   TempSrc: Temporal   SpO2: 100%   Weight: 220 lb (99.8 kg)   Height: 6\' 1"  (1.854 m)      Physical Exam Vitals reviewed.  Constitutional:      General: He is not in acute distress.    Appearance: He is well-developed.  HENT:     Head: Normocephalic and atraumatic.  Cardiovascular:     Rate and Rhythm: Normal rate.  Pulmonary:     Effort: Pulmonary effort is normal.  Musculoskeletal:     Right lower leg: Swelling (39cm circumference at 15cm below patlla on R vs 35 cm on left. ? cord medial calf. postive homans. calf diffuselty ttp) and tenderness present. No bony tenderness. No edema.     Left lower leg: No swelling or tenderness.  Neurological:     Mental Status: He is alert and oriented to person, place, and time.      Assessment & Plan:  Adam Cortez is a 54 y.o. male . Swelling of calf - Plan: VAS [place] LOWER EXTREMITY VENOUS (DVT) Right calf pain - Plan: VAS [place] LOWER EXTREMITY VENOUS (DVT)  -Does have some posterior calf pain, possible cord medially.  Differential includes painful varicose vein versus DVT.  Radiating pain down leg likely related to lumbar spine, currently under care of orthopedist, Worker's Comp.  Check ultrasound, if no DVT, would recommend follow-up with his treating orthopedist.  Consider vein specialist eval if painful varicose veins.  RTC/ER precautions.  Need for vaccination - Plan: Flu Vaccine QUAD 36+ mos IM, VAS [place] LOWER EXTREMITY VENOUS  (DVT)  No orders of the defined types were placed in this encounter.  Patient Instructions     I will check an ultrasound of your calf to rule out a deep vein thrombosis/blood clot.  If that is negative/normal, then follow-up with your treating orthopedist for possible back source of pain.  Additionally contact your treating orthopedist/Worker's Comp treating provider  regarding questions of neurology referral.   Please let me know if I can help further and take care.   If you have lab work done today you will be contacted with your lab results within the next 2 weeks.  If you have not heard from [place] then please contact us. The fastest way to get your results is to register for My Chart.   IF you received an x-ray today, you will receive an invoice from [facility] Radiology. Please contact [facility] Radiology at [phone] with questions or concerns regarding your invoice.   IF you received labwork today, you will receive an invoice from [place]. Please contact [facility] at [phone] with questions or concerns regarding your invoice.   Our billing staff will not be able to assist you with questions regarding bills from these companies.  You will be contacted with the lab results as soon as they are available. The fastest way to get your results is to activate your My Chart account. Instructions are located on the last page of this paperwork. If you have not heard from [place] regarding the results in 2 weeks, please contact this office.         Signed, [name], MD [facility]

## 2020-04-02 NOTE — Patient Instructions (Addendum)
   I will check an ultrasound of your calf to rule out a deep vein thrombosis/blood clot.  If that is negative/normal, then follow-up with your treating orthopedist for possible back source of pain.  Additionally contact your treating orthopedist/Worker's Comp treating provider regarding questions of neurology referral.   Please let me know if I can help further and take care.   If you have lab work done today you will be contacted with your lab results within the next 2 weeks.  If you have not heard from [place] then please contact us. The fastest way to get your results is to register for My Chart.   IF you received an x-ray today, you will receive an invoice from [facility]. Please contact [facility] Radiology at [phone] with questions or concerns regarding your invoice.   IF you received labwork today, you will receive an invoice from [place]. Please contact [facility] at [phone] with questions or concerns regarding your invoice.   Our billing staff will not be able to assist you with questions regarding bills from these companies.  You will be contacted with the lab results as soon as they are available. The fastest way to get your results is to activate your My Chart account. Instructions are located on the last page of this paperwork. If you have not heard from [place] regarding the results in 2 weeks, please contact this office.

## 2020-04-13 ENCOUNTER — Ambulatory Visit (HOSPITAL_COMMUNITY)
Admission: RE | Admit: 2020-04-13 | Discharge: 2020-04-13 | Disposition: A | Discharge status: Home / Self Care | Payer: BC Managed Care – PPO | Source: Ambulatory Visit | Attending: Family Medicine | Admitting: Family Medicine

## 2020-04-13 ENCOUNTER — Other Ambulatory Visit: Payer: Self-pay

## 2020-04-13 DIAGNOSIS — M7989 Other specified soft tissue disorders: Secondary | ICD-10-CM | POA: Diagnosis not present

## 2020-04-13 DIAGNOSIS — Z23 Encounter for immunization: Secondary | ICD-10-CM | POA: Diagnosis not present

## 2020-04-13 DIAGNOSIS — M79661 Pain in right lower leg: Secondary | ICD-10-CM | POA: Insufficient documentation

## 2020-04-13 NOTE — Progress Notes (Signed)
Lower extremity venous has been completed.   Preliminary results in CV Proc.   Abram Sander 04/13/2020 10:20 AM

## 2020-04-18 DIAGNOSIS — M5412 Radiculopathy, cervical region: Secondary | ICD-10-CM | POA: Diagnosis not present

## 2020-04-18 DIAGNOSIS — M5417 Radiculopathy, lumbosacral region: Secondary | ICD-10-CM | POA: Diagnosis not present

## 2020-04-18 DIAGNOSIS — R531 Weakness: Secondary | ICD-10-CM | POA: Diagnosis not present

## 2020-04-18 DIAGNOSIS — R202 Paresthesia of skin: Secondary | ICD-10-CM | POA: Diagnosis not present

## 2020-04-18 DIAGNOSIS — R42 Dizziness and giddiness: Secondary | ICD-10-CM | POA: Diagnosis not present

## 2020-04-18 DIAGNOSIS — G5603 Carpal tunnel syndrome, bilateral upper limbs: Secondary | ICD-10-CM | POA: Diagnosis not present

## 2020-04-18 DIAGNOSIS — R519 Headache, unspecified: Secondary | ICD-10-CM | POA: Diagnosis not present

## 2020-04-25 DIAGNOSIS — M79601 Pain in right arm: Secondary | ICD-10-CM | POA: Diagnosis not present

## 2020-04-25 DIAGNOSIS — M5417 Radiculopathy, lumbosacral region: Secondary | ICD-10-CM | POA: Diagnosis not present

## 2020-04-25 DIAGNOSIS — M25512 Pain in left shoulder: Secondary | ICD-10-CM | POA: Diagnosis not present

## 2020-04-25 DIAGNOSIS — Z79899 Other long term (current) drug therapy: Secondary | ICD-10-CM | POA: Diagnosis not present

## 2020-04-25 DIAGNOSIS — M79602 Pain in left arm: Secondary | ICD-10-CM | POA: Diagnosis not present

## 2020-06-20 DIAGNOSIS — M79601 Pain in right arm: Secondary | ICD-10-CM | POA: Diagnosis not present

## 2020-06-20 DIAGNOSIS — M79602 Pain in left arm: Secondary | ICD-10-CM | POA: Diagnosis not present

## 2020-06-20 DIAGNOSIS — R201 Hypoesthesia of skin: Secondary | ICD-10-CM | POA: Diagnosis not present

## 2020-06-20 DIAGNOSIS — M5417 Radiculopathy, lumbosacral region: Secondary | ICD-10-CM | POA: Diagnosis not present

## 2020-06-20 DIAGNOSIS — R531 Weakness: Secondary | ICD-10-CM | POA: Diagnosis not present

## 2020-06-20 DIAGNOSIS — R270 Ataxia, unspecified: Secondary | ICD-10-CM | POA: Diagnosis not present

## 2020-06-20 DIAGNOSIS — G5603 Carpal tunnel syndrome, bilateral upper limbs: Secondary | ICD-10-CM | POA: Diagnosis not present

## 2020-07-18 DIAGNOSIS — M79601 Pain in right arm: Secondary | ICD-10-CM | POA: Diagnosis not present

## 2020-07-18 DIAGNOSIS — R42 Dizziness and giddiness: Secondary | ICD-10-CM | POA: Diagnosis not present

## 2020-07-18 DIAGNOSIS — M542 Cervicalgia: Secondary | ICD-10-CM | POA: Diagnosis not present

## 2020-07-18 DIAGNOSIS — M79602 Pain in left arm: Secondary | ICD-10-CM | POA: Diagnosis not present

## 2020-07-18 DIAGNOSIS — M5417 Radiculopathy, lumbosacral region: Secondary | ICD-10-CM | POA: Diagnosis not present

## 2020-07-18 DIAGNOSIS — G43711 Chronic migraine without aura, intractable, with status migrainosus: Secondary | ICD-10-CM | POA: Diagnosis not present

## 2020-09-07 DIAGNOSIS — R519 Headache, unspecified: Secondary | ICD-10-CM | POA: Diagnosis not present

## 2020-09-11 DIAGNOSIS — M792 Neuralgia and neuritis, unspecified: Secondary | ICD-10-CM | POA: Diagnosis not present

## 2020-09-12 DIAGNOSIS — M5412 Radiculopathy, cervical region: Secondary | ICD-10-CM | POA: Diagnosis not present

## 2020-09-12 DIAGNOSIS — M545 Low back pain, unspecified: Secondary | ICD-10-CM | POA: Diagnosis not present

## 2020-09-12 DIAGNOSIS — M5417 Radiculopathy, lumbosacral region: Secondary | ICD-10-CM | POA: Diagnosis not present

## 2020-09-12 DIAGNOSIS — M542 Cervicalgia: Secondary | ICD-10-CM | POA: Diagnosis not present

## 2020-09-17 DIAGNOSIS — R519 Headache, unspecified: Secondary | ICD-10-CM | POA: Diagnosis not present

## 2020-10-09 DIAGNOSIS — M545 Low back pain, unspecified: Secondary | ICD-10-CM | POA: Diagnosis not present

## 2020-10-09 DIAGNOSIS — M542 Cervicalgia: Secondary | ICD-10-CM | POA: Diagnosis not present

## 2020-10-09 DIAGNOSIS — G43711 Chronic migraine without aura, intractable, with status migrainosus: Secondary | ICD-10-CM | POA: Diagnosis not present

## 2020-10-09 DIAGNOSIS — G5603 Carpal tunnel syndrome, bilateral upper limbs: Secondary | ICD-10-CM | POA: Diagnosis not present

## 2020-12-31 ENCOUNTER — Encounter: Payer: Self-pay | Admitting: Internal Medicine

## 2021-05-31 IMAGING — CR DG FEMUR 2+V*L*
4 series · 4 of 4 positions shown · non-contrast
Comparison: None.

CLINICAL DATA: Fell off ladder

EXAM:
LEFT FEMUR 2 VIEWS

[femur ap (1 of 2)]
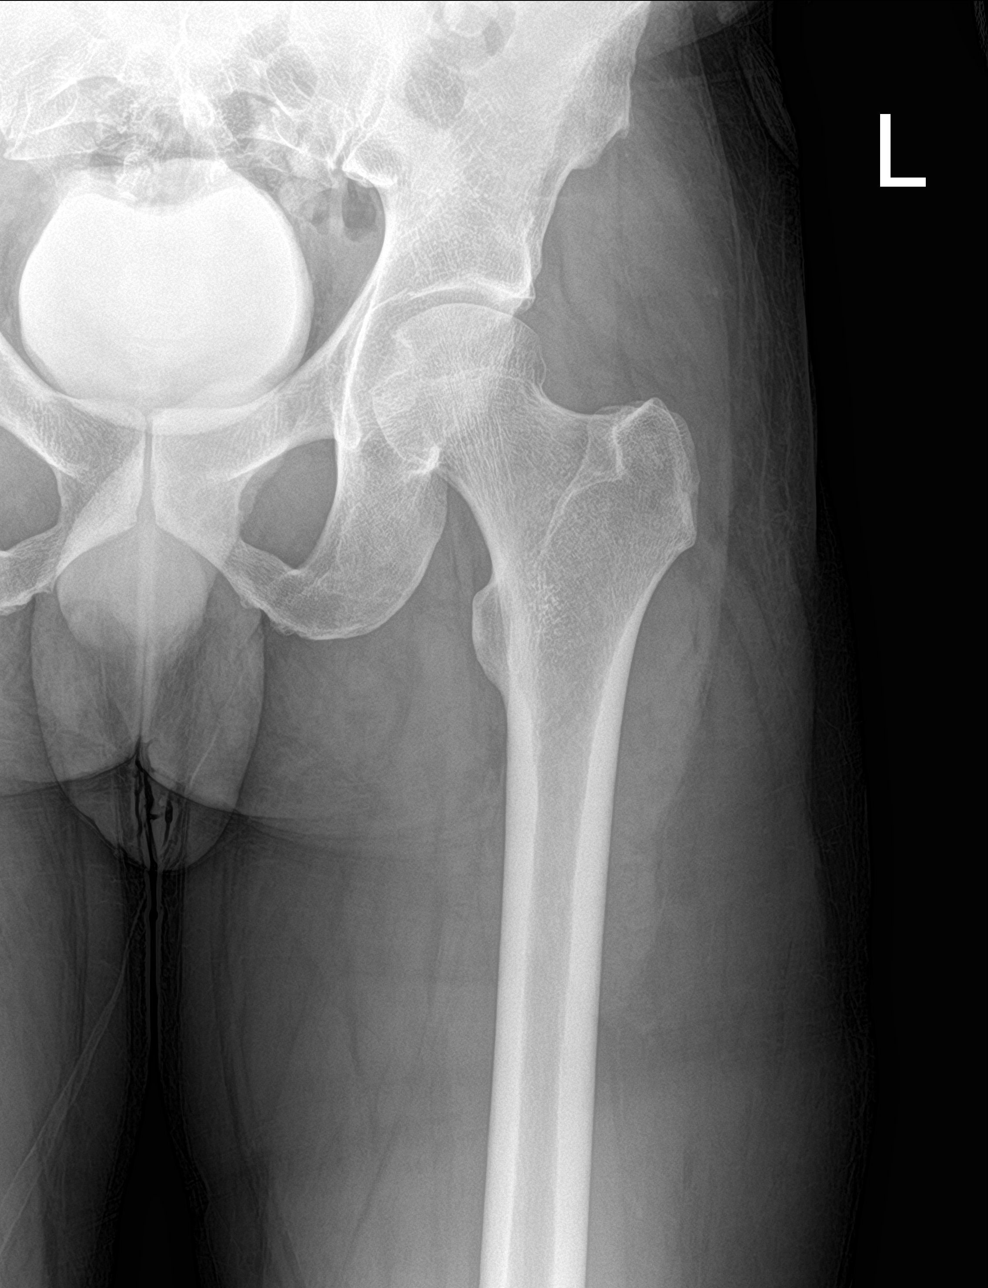

[femur ap (2 of 2)]
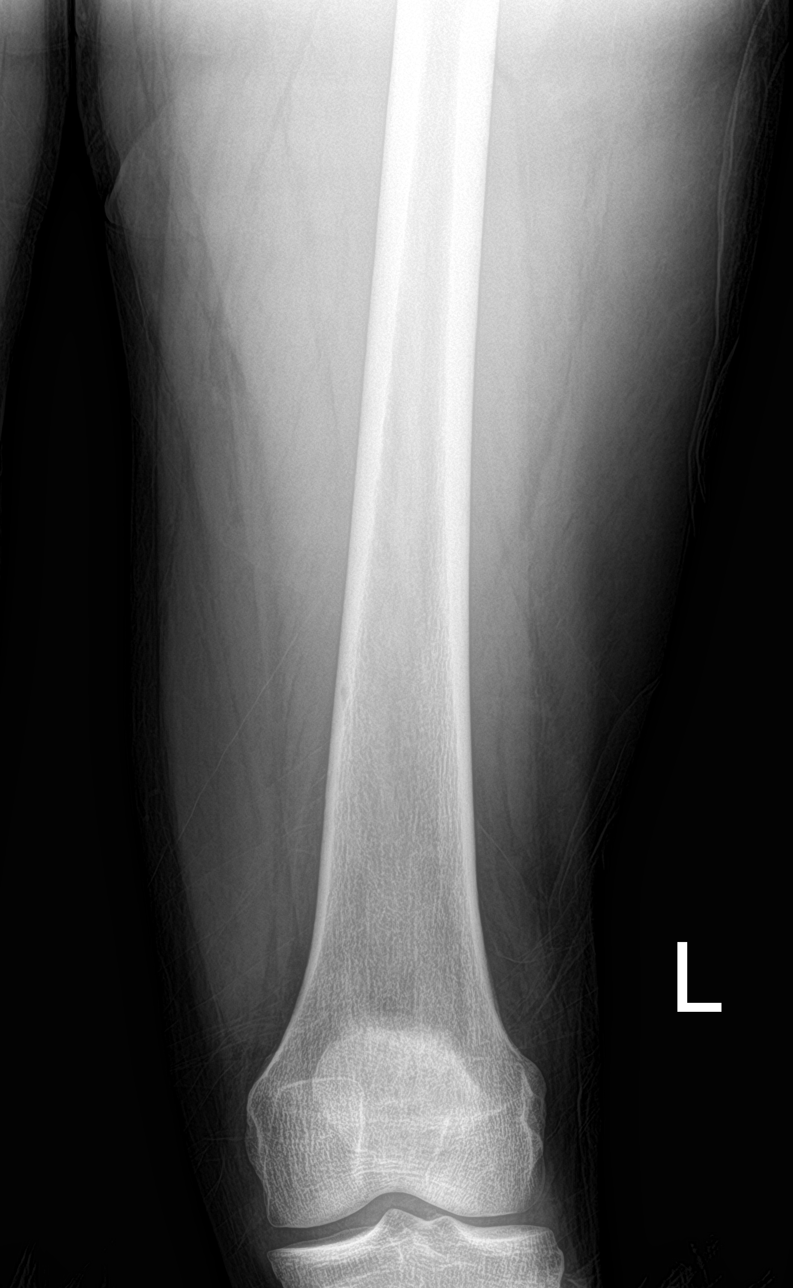

[femur lat (1 of 2)]
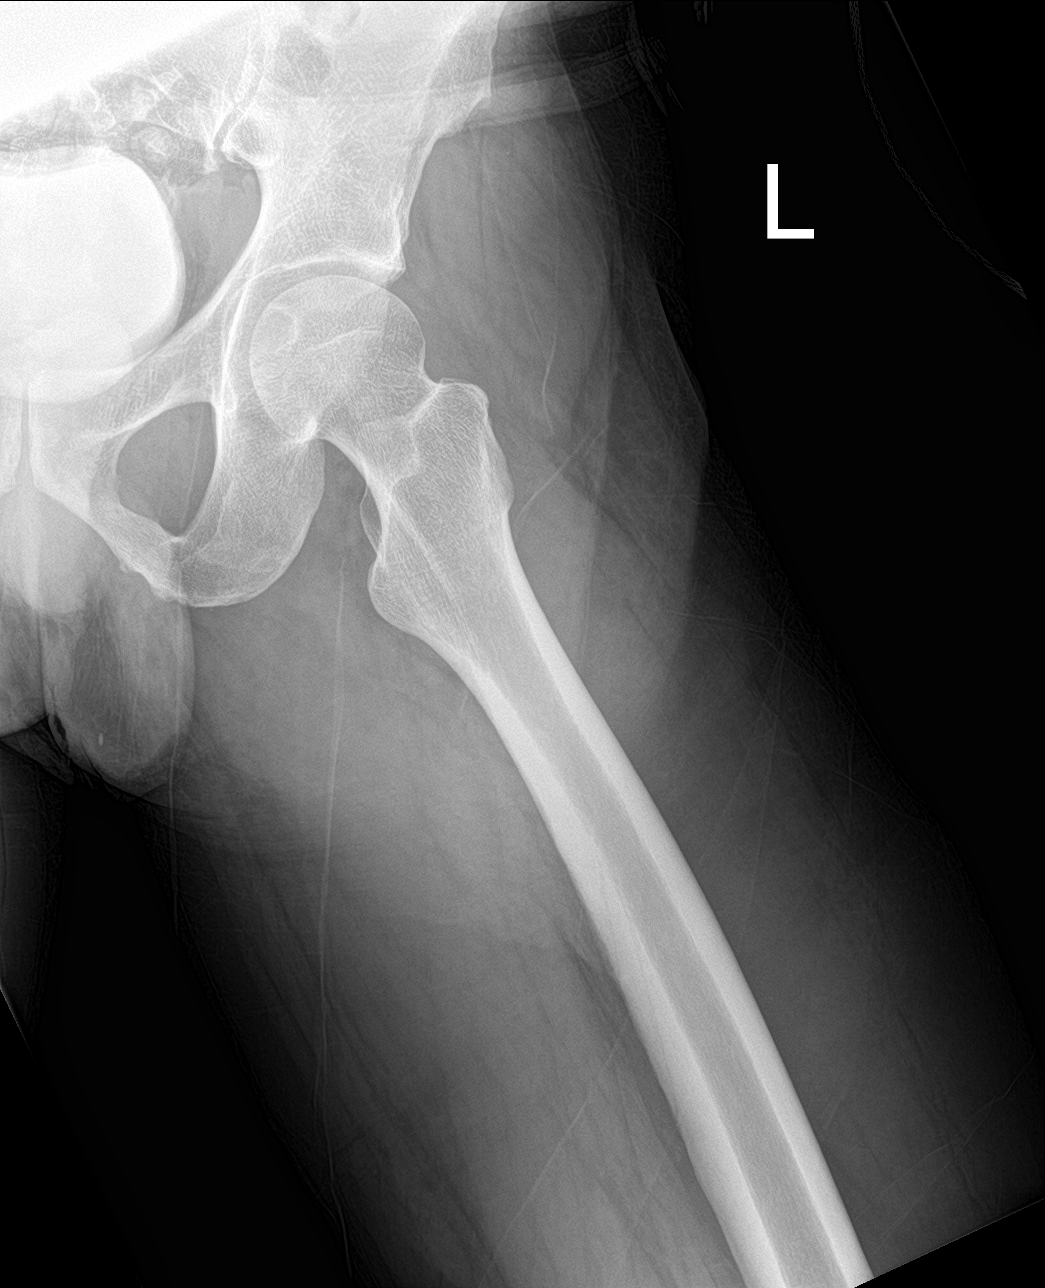

[femur lat (2 of 2)]
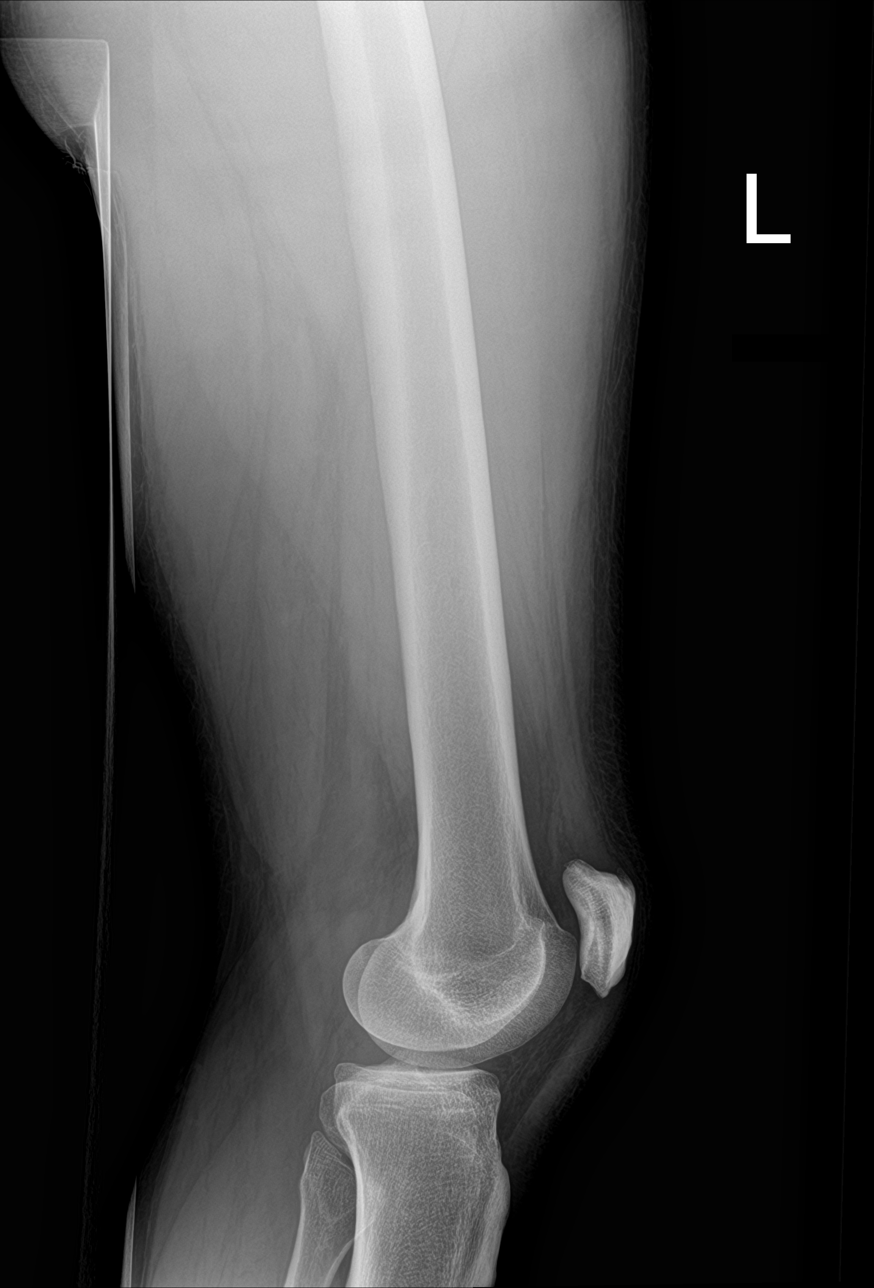

[4 of 4 positions shown; findings below may reference images not displayed]

FINDINGS: Excreted contrast in the urinary bladder. No fracture or
malalignment.
IMPRESSION: Negative.

## 2021-05-31 IMAGING — CT CT HEAD W/O CM
3 series · 15 of 47 positions shown, 18 images · non-contrast
Comparison: None.

CLINICAL DATA: Trauma, fall from ladder

EXAM:
CT HEAD WITHOUT CONTRAST
CT CERVICAL SPINE WITHOUT CONTRAST
TECHNIQUE: Multidetector CT imaging of the head and cervical spine was
performed following the standard protocol without intravenous
contrast. Multiplanar CT image reconstructions of the cervical spine
were also generated.

[Series 3: head 5.0 h30s · axial · 0.43mm/px · z∈[-87,+43]mm · 9 of 32 slices shown, 12 images]
[im 3/32  brain]
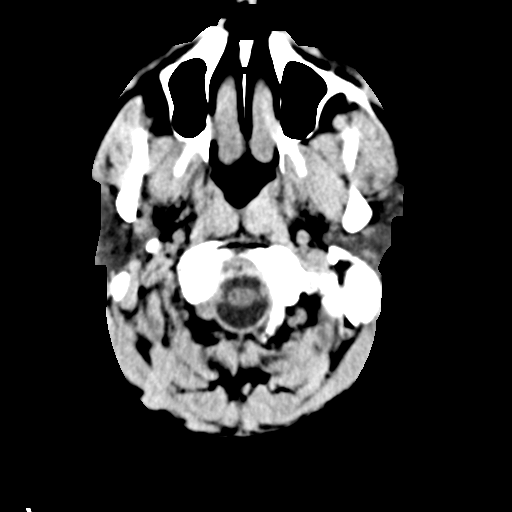
[im 3/32  bone]
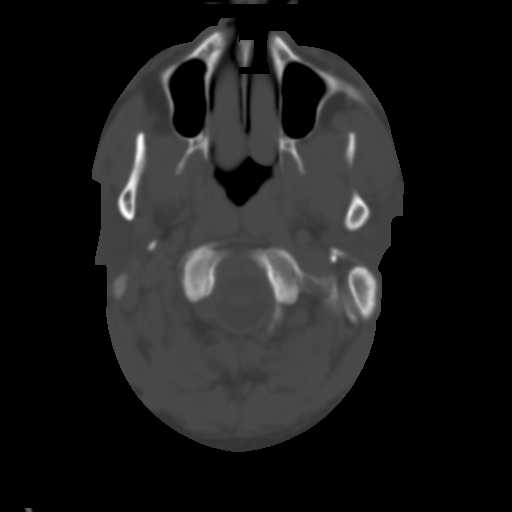
[im 6/32  brain]
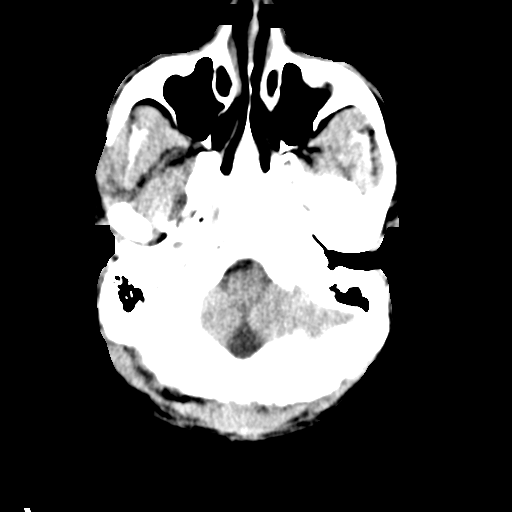
[im 9/32  brain]
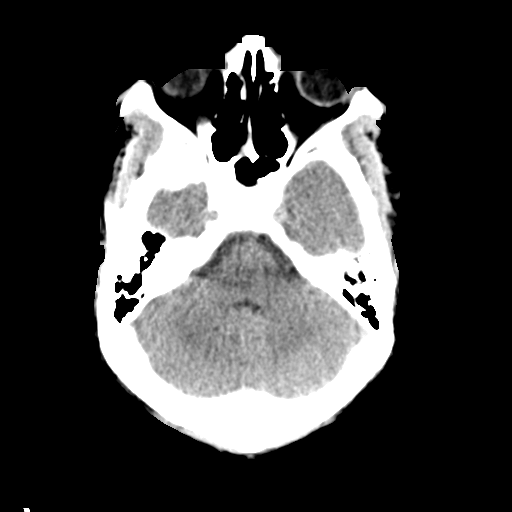
[im 12/32  brain]
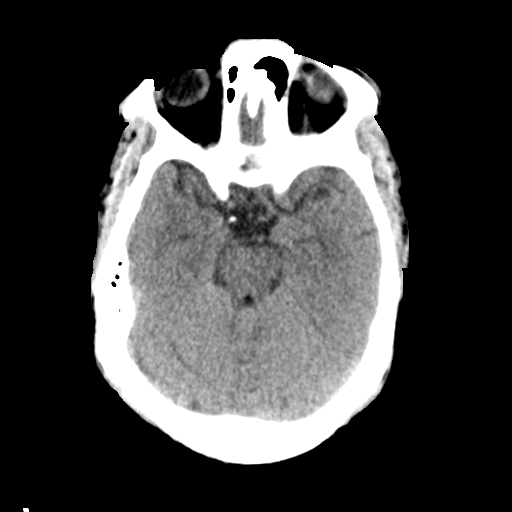
[im 17/32  brain]
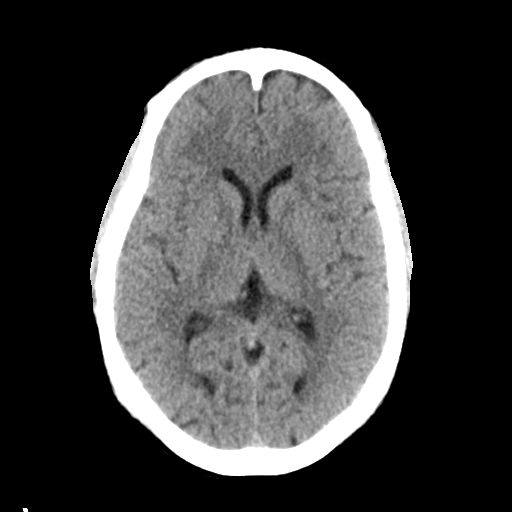
[im 17/32  bone]
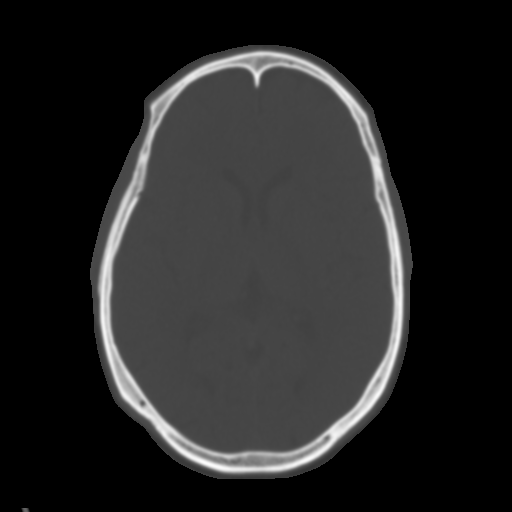
[im 20/32  brain]
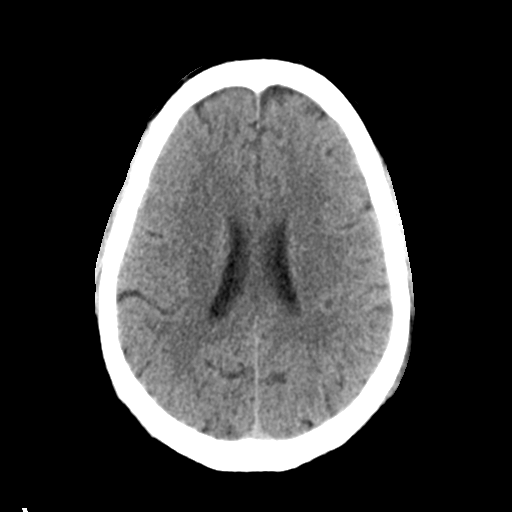
[im 23/32  brain]
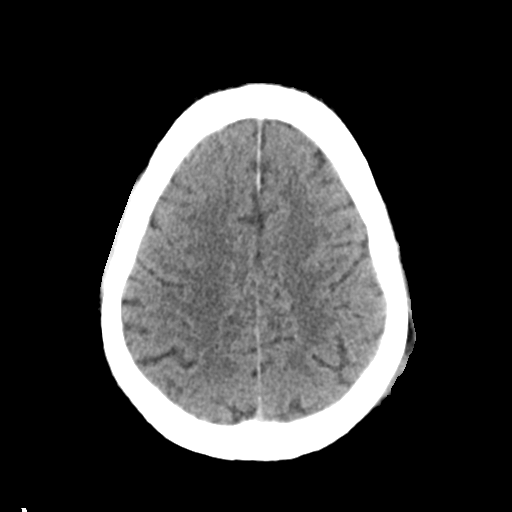
[im 26/32  brain]
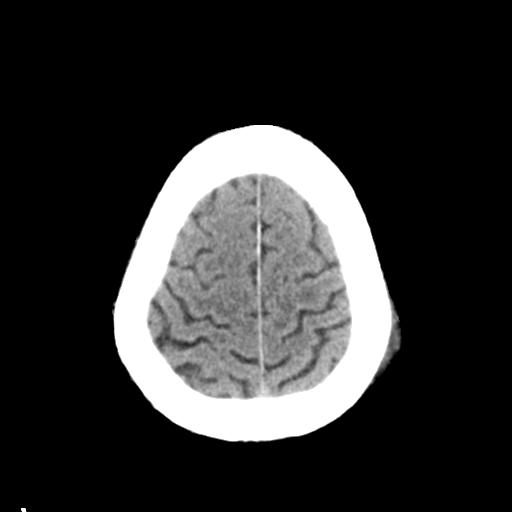
[im 29/32  brain]
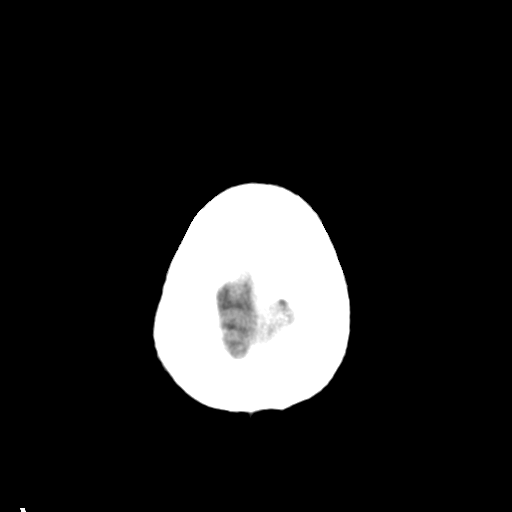
[im 29/32  bone]
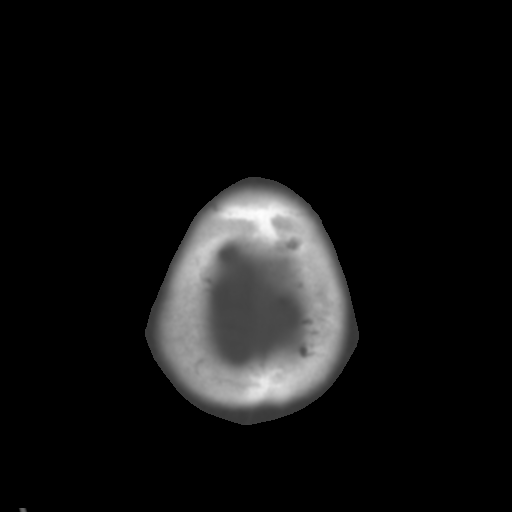

[Series 5: head 3.0 mpr cor · coronal · 0.35mm/px · 3 of 68 slices shown]
[im 23/68  brain]
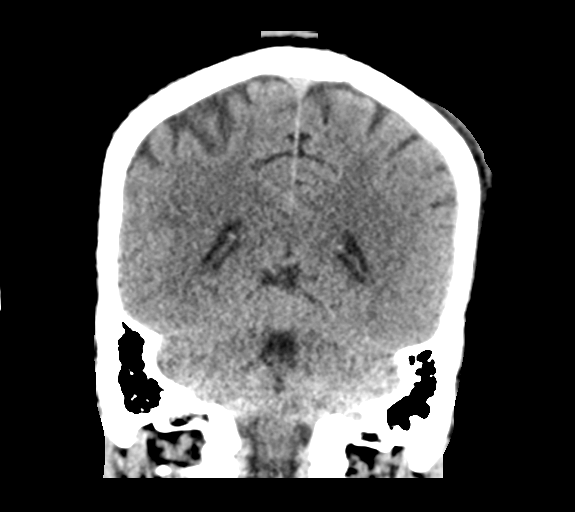
[im 30/68  brain]
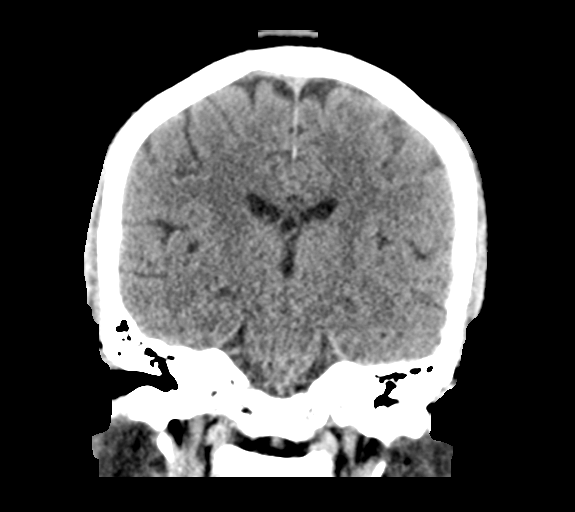
[im 38/68  brain]
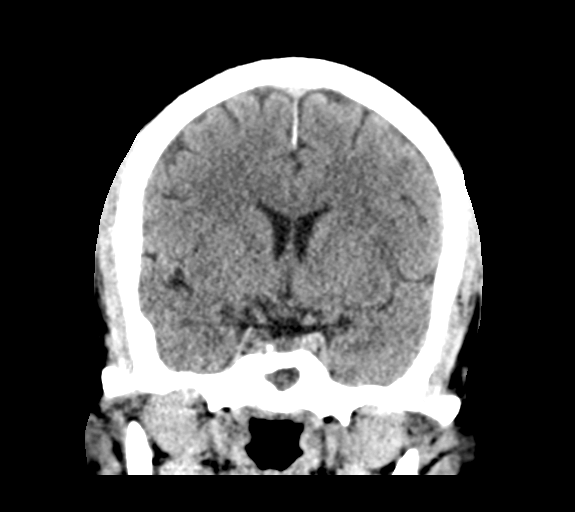

[Series 6: head 3.0 mpr sag · sagittal · 0.35mm/px · 3 of 49 slices shown]
[im 17/49  brain]
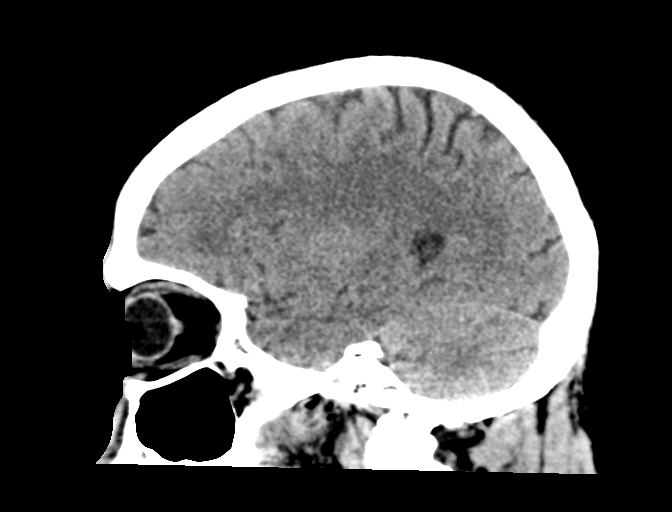
[im 25/49  brain]
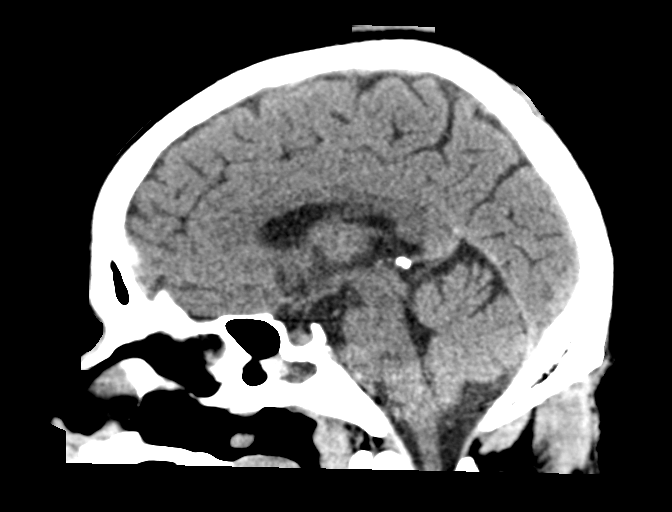
[im 33/49  brain]
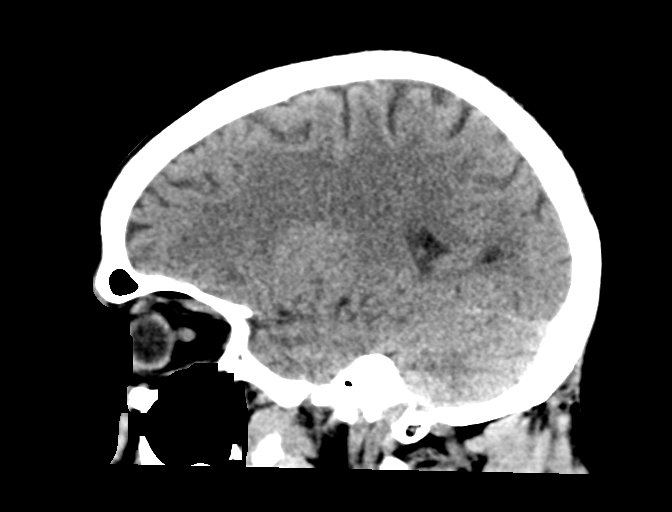

[15 of 47 positions shown; findings below may reference images not displayed]

FINDINGS: CT HEAD FINDINGS

Brain: No evidence of acute infarction, hemorrhage, hydrocephalus,
extra-axial collection or mass lesion/mass effect.

Vascular: No hyperdense vessel or unexpected calcification.

Skull: Normal. Negative for fracture or focal lesion.

Sinuses/Orbits: No acute finding.

Other: Soft tissue laceration and contusion of the left parietal
scalp.

CT CERVICAL SPINE FINDINGS

Alignment: Normal.

Skull base and vertebrae: No acute fracture. No primary bone lesion
or focal pathologic process.

Soft tissues and spinal canal: No prevertebral fluid or swelling. No
visible canal hematoma.

Disc levels:  Intact.

Upper chest: Negative.

Other: None.
IMPRESSION: 1.  No acute intracranial pathology.

2.  Soft tissue laceration and contusion of the left parietal scalp.

3.  No fracture or static subluxation of the cervical spine.

## 2021-05-31 IMAGING — CT CT CERVICAL SPINE W/O CM
3 of 4 series · 13 of 33 positions shown, 16 images · non-contrast
Comparison: None.

CLINICAL DATA: Trauma, fall from ladder

EXAM:
CT HEAD WITHOUT CONTRAST
CT CERVICAL SPINE WITHOUT CONTRAST
TECHNIQUE: Multidetector CT imaging of the head and cervical spine was
performed following the standard protocol without intravenous
contrast. Multiplanar CT image reconstructions of the cervical spine
were also generated.

[Series 5: c_spine 2.0 st · axial · 0.33mm/px · z∈[-224,-98]mm · 5 of 95 slices shown, 7 images]
[im 16/95  soft-tissue]
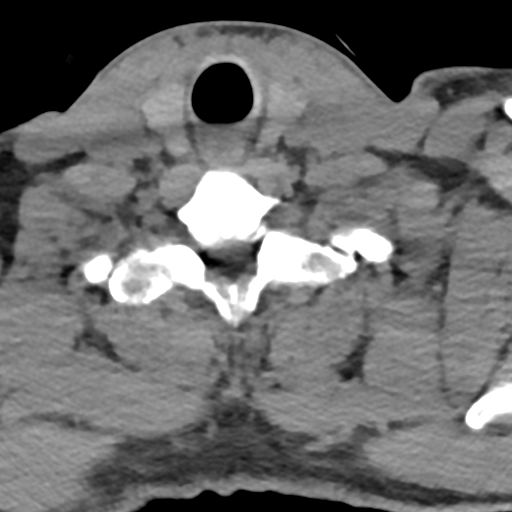
[im 16/95  bone]
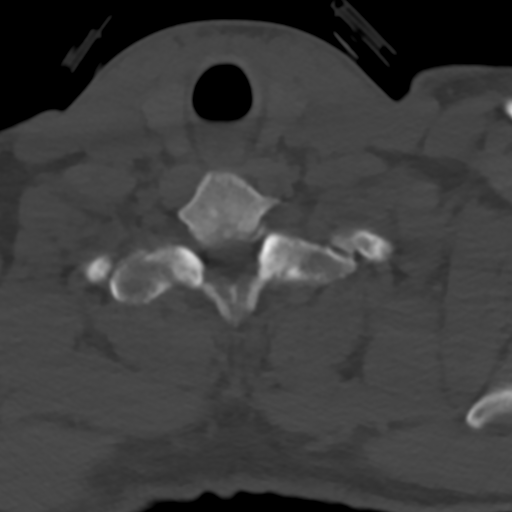
[im 32/95  bone]
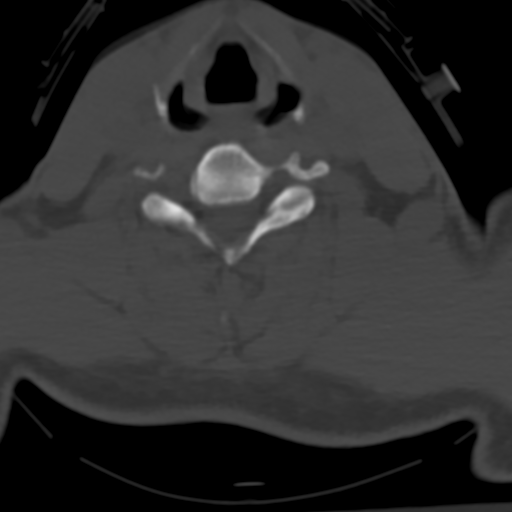
[im 48/95  bone]
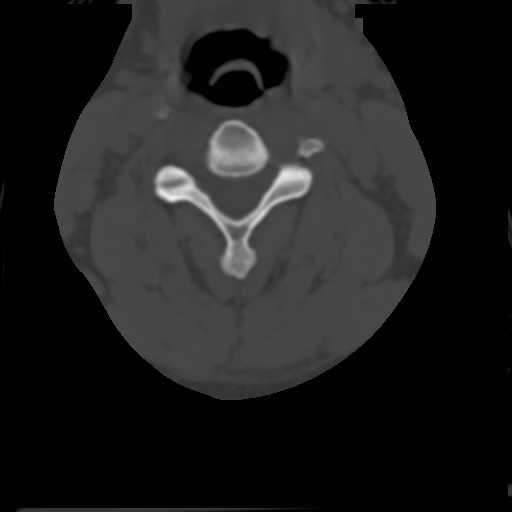
[im 63/95  bone]
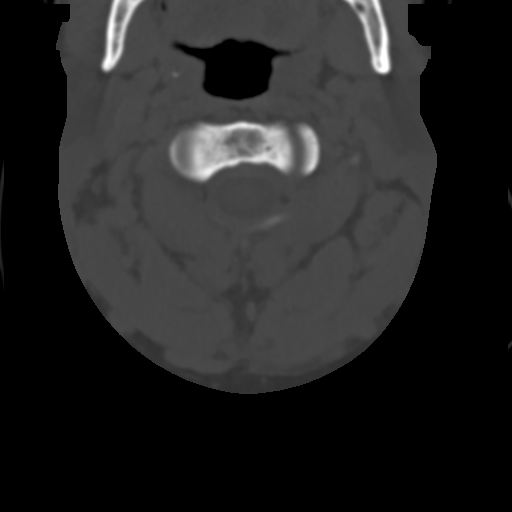
[im 79/95  soft-tissue]
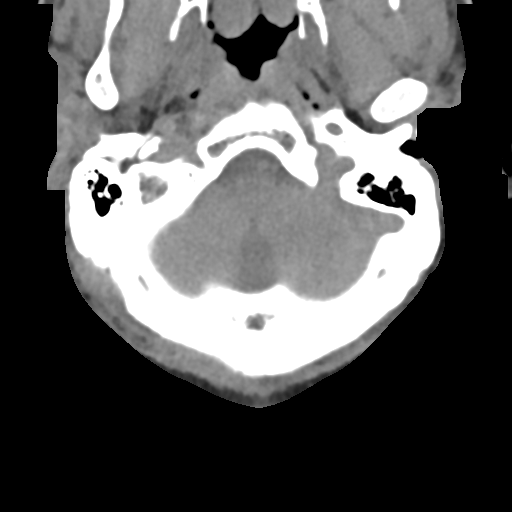
[im 79/95  bone]
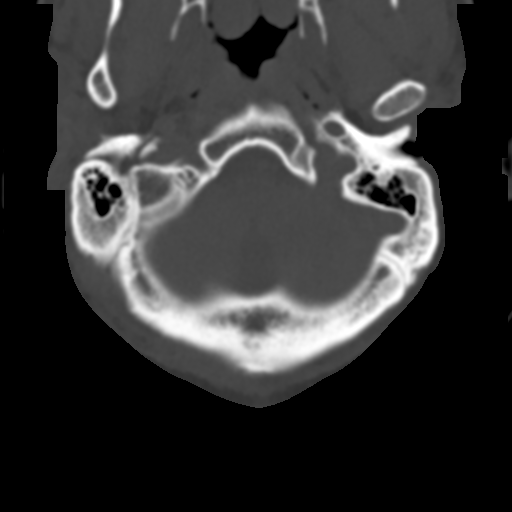

[Series 6: coronal bone · coronal · 0.35mm/px · 3 of 61 slices shown]
[im 13/61  bone]
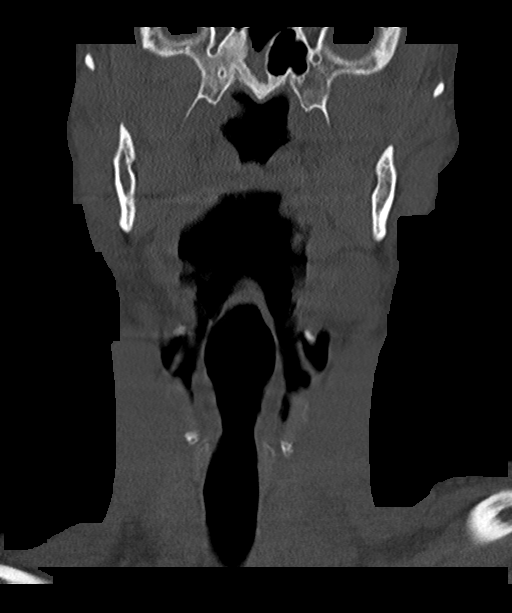
[im 25/61  bone]
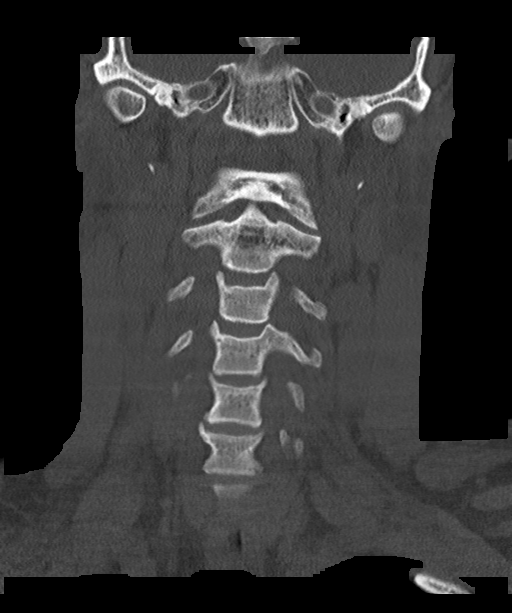
[im 37/61  bone]
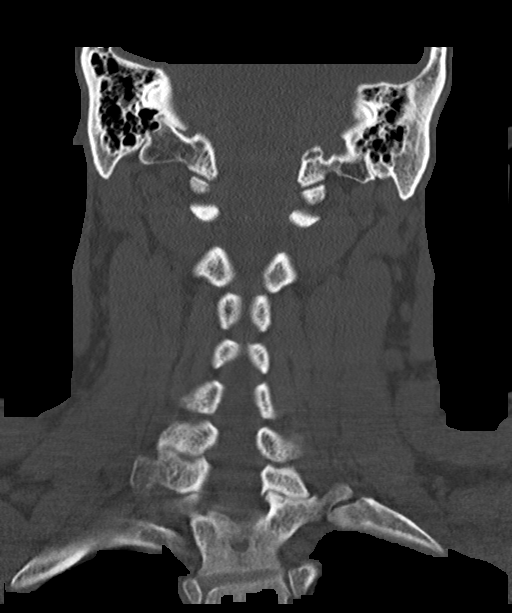

[Series 7: sagittal bone · sagittal · 0.31mm/px · 5 of 50 slices shown, 6 images]
[im 17/50  bone]
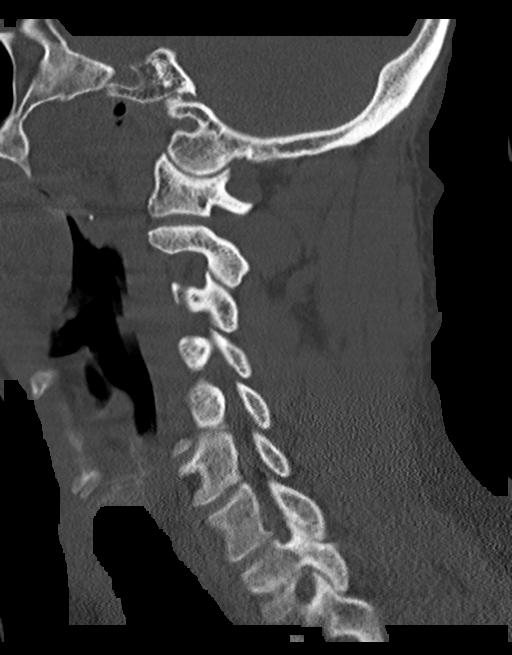
[im 21/50  bone]
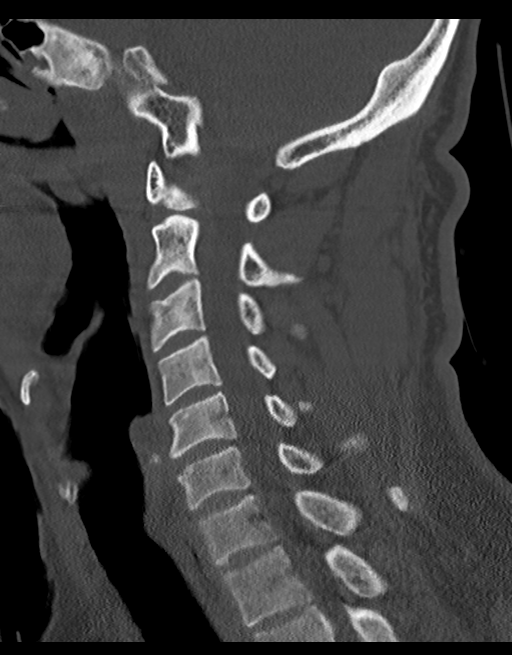
[im 25/50  soft-tissue]
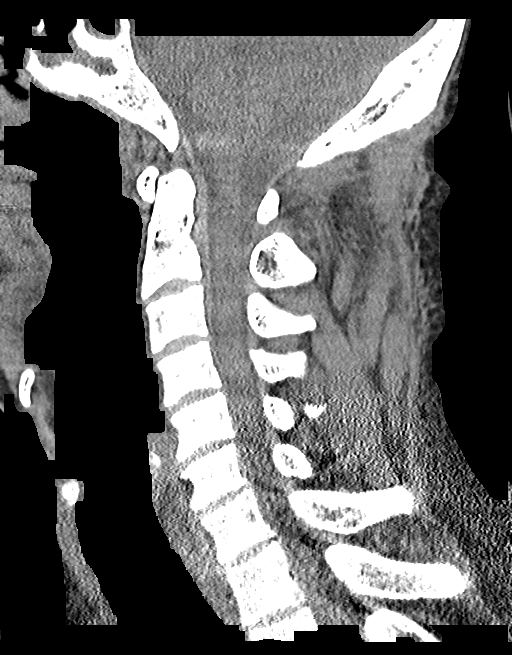
[im 25/50  bone]
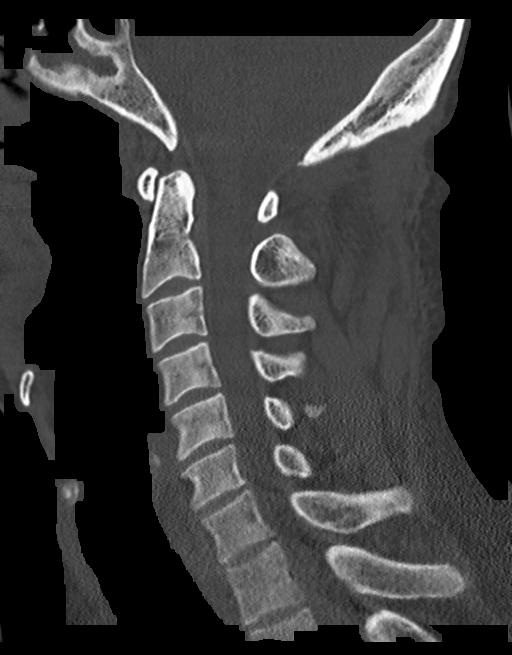
[im 29/50  bone]
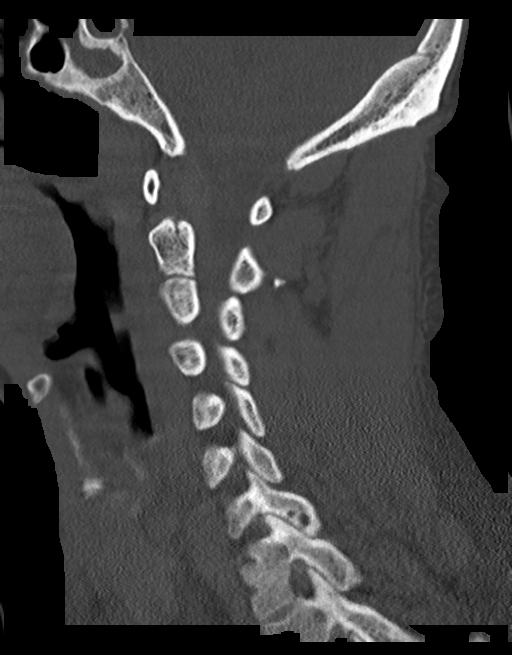
[im 33/50  bone]
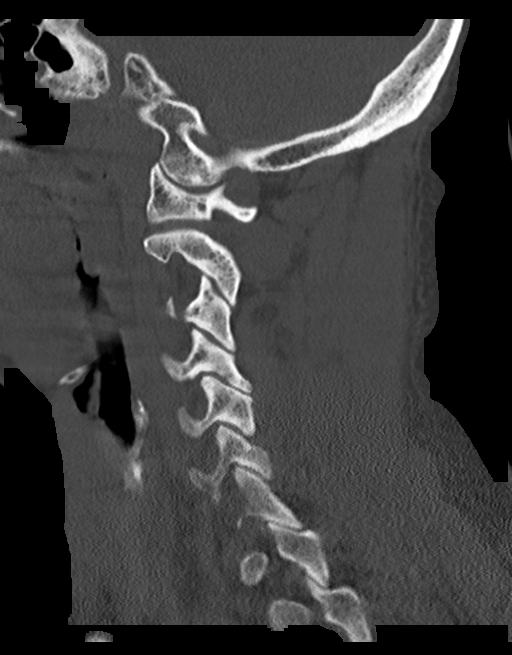

[13 of 33 positions shown; findings below may reference images not displayed]

FINDINGS: CT HEAD FINDINGS

Brain: No evidence of acute infarction, hemorrhage, hydrocephalus,
extra-axial collection or mass lesion/mass effect.

Vascular: No hyperdense vessel or unexpected calcification.

Skull: Normal. Negative for fracture or focal lesion.

Sinuses/Orbits: No acute finding.

Other: Soft tissue laceration and contusion of the left parietal
scalp.

CT CERVICAL SPINE FINDINGS

Alignment: Normal.

Skull base and vertebrae: No acute fracture. No primary bone lesion
or focal pathologic process.

Soft tissues and spinal canal: No prevertebral fluid or swelling. No
visible canal hematoma.

Disc levels:  Intact.

Upper chest: Negative.

Other: None.
IMPRESSION: 1.  No acute intracranial pathology.

2.  Soft tissue laceration and contusion of the left parietal scalp.

3.  No fracture or static subluxation of the cervical spine.

## 2022-01-22 ENCOUNTER — Ambulatory Visit: Payer: BC Managed Care – PPO | Admitting: Diagnostic Neuroimaging
